# Patient Record
Sex: Male | Born: 1965 | Race: White | Hispanic: Yes | Marital: Married | Smoking: Never smoker
Health system: Southern US, Community
[De-identification: ages and names within clinical notes are randomized; demographics above are authoritative.]

## PROBLEM LIST (undated history)

## (undated) DIAGNOSIS — F1010 Alcohol abuse, uncomplicated: Secondary | ICD-10-CM

## (undated) DIAGNOSIS — E785 Hyperlipidemia, unspecified: Secondary | ICD-10-CM

## (undated) DIAGNOSIS — E669 Obesity, unspecified: Secondary | ICD-10-CM

## (undated) DIAGNOSIS — K2970 Gastritis, unspecified, without bleeding: Secondary | ICD-10-CM

## (undated) DIAGNOSIS — B179 Acute viral hepatitis, unspecified: Secondary | ICD-10-CM

## (undated) DIAGNOSIS — M109 Gout, unspecified: Secondary | ICD-10-CM

## (undated) DIAGNOSIS — K649 Unspecified hemorrhoids: Secondary | ICD-10-CM

## (undated) DIAGNOSIS — K5790 Diverticulosis of intestine, part unspecified, without perforation or abscess without bleeding: Secondary | ICD-10-CM

## (undated) DIAGNOSIS — Z86010 Personal history of colonic polyps: Secondary | ICD-10-CM

## (undated) DIAGNOSIS — D509 Iron deficiency anemia, unspecified: Secondary | ICD-10-CM

## (undated) HISTORY — DX: Gout, unspecified: M10.9

## (undated) HISTORY — DX: Obesity, unspecified: E66.9

## (undated) HISTORY — DX: Unspecified hemorrhoids: K64.9

## (undated) HISTORY — DX: Alcohol abuse, uncomplicated: F10.10

## (undated) HISTORY — DX: Personal history of colonic polyps: Z86.010

## (undated) HISTORY — DX: Acute viral hepatitis, unspecified: B17.9

## (undated) HISTORY — PX: KNEE ARTHROSCOPY: SUR90

## (undated) HISTORY — DX: Iron deficiency anemia, unspecified: D50.9

## (undated) HISTORY — DX: Diverticulosis of intestine, part unspecified, without perforation or abscess without bleeding: K57.90

## (undated) HISTORY — DX: Gastritis, unspecified, without bleeding: K29.70

---

## 1998-11-01 ENCOUNTER — Emergency Department (HOSPITAL_COMMUNITY): Admission: EM | Admit: 1998-11-01 | Discharge: 1998-11-01 | Payer: Self-pay | Admitting: Emergency Medicine

## 2000-08-11 ENCOUNTER — Emergency Department (HOSPITAL_COMMUNITY): Admission: EM | Admit: 2000-08-11 | Discharge: 2000-08-11 | Payer: Self-pay | Admitting: Emergency Medicine

## 2003-03-13 ENCOUNTER — Emergency Department (HOSPITAL_COMMUNITY): Admission: EM | Admit: 2003-03-13 | Discharge: 2003-03-14 | Payer: Self-pay | Admitting: *Deleted

## 2005-07-05 ENCOUNTER — Emergency Department (HOSPITAL_COMMUNITY): Admission: EM | Admit: 2005-07-05 | Discharge: 2005-07-05 | Payer: Self-pay | Admitting: Emergency Medicine

## 2006-08-08 ENCOUNTER — Emergency Department (HOSPITAL_COMMUNITY): Admission: EM | Admit: 2006-08-08 | Discharge: 2006-08-08 | Payer: Self-pay | Admitting: Family Medicine

## 2009-10-01 ENCOUNTER — Emergency Department (HOSPITAL_COMMUNITY): Admission: EM | Admit: 2009-10-01 | Discharge: 2009-10-01 | Payer: Self-pay | Admitting: Family Medicine

## 2009-10-01 ENCOUNTER — Inpatient Hospital Stay (HOSPITAL_COMMUNITY): Admission: EM | Admit: 2009-10-01 | Discharge: 2009-10-03 | Payer: Self-pay | Admitting: Emergency Medicine

## 2010-11-30 LAB — CBC
HCT: 37.7 % — ABNORMAL LOW (ref 39.0–52.0)
MCHC: 35.4 g/dL (ref 30.0–36.0)
MCV: 93.3 fL (ref 78.0–100.0)
Platelets: 253 10*3/uL (ref 150–400)
RDW: 13.1 % (ref 11.5–15.5)
WBC: 7.9 10*3/uL (ref 4.0–10.5)

## 2010-11-30 LAB — DIFFERENTIAL
Basophils Absolute: 0 10*3/uL (ref 0.0–0.1)
Basophils Relative: 0 % (ref 0–1)
Eosinophils Absolute: 0 10*3/uL (ref 0.0–0.7)
Eosinophils Relative: 1 % (ref 0–5)
Lymphs Abs: 1.3 10*3/uL (ref 0.7–4.0)
Neutrophils Relative %: 78 % — ABNORMAL HIGH (ref 43–77)

## 2010-11-30 LAB — BASIC METABOLIC PANEL
BUN: 12 mg/dL (ref 6–23)
Chloride: 100 mEq/L (ref 96–112)
Creatinine, Ser: 0.65 mg/dL (ref 0.4–1.5)
Glucose, Bld: 96 mg/dL (ref 70–99)
Potassium: 3.2 mEq/L — ABNORMAL LOW (ref 3.5–5.1)

## 2010-11-30 LAB — PROTIME-INR
INR: 0.99 (ref 0.00–1.49)
Prothrombin Time: 13 seconds (ref 11.6–15.2)

## 2012-06-23 ENCOUNTER — Emergency Department (HOSPITAL_COMMUNITY): Payer: Self-pay

## 2012-06-23 ENCOUNTER — Encounter (HOSPITAL_COMMUNITY): Payer: Self-pay | Admitting: Emergency Medicine

## 2012-06-23 ENCOUNTER — Observation Stay (HOSPITAL_COMMUNITY)
Admission: EM | Admit: 2012-06-23 | Discharge: 2012-06-24 | Disposition: A | Discharge status: Home / Self Care | Payer: Self-pay | Attending: General Surgery | Admitting: General Surgery

## 2012-06-23 DIAGNOSIS — M545 Low back pain, unspecified: Secondary | ICD-10-CM

## 2012-06-23 DIAGNOSIS — S2242XA Multiple fractures of ribs, left side, initial encounter for closed fracture: Secondary | ICD-10-CM | POA: Diagnosis present

## 2012-06-23 DIAGNOSIS — S32009A Unspecified fracture of unspecified lumbar vertebra, initial encounter for closed fracture: Secondary | ICD-10-CM

## 2012-06-23 DIAGNOSIS — I959 Hypotension, unspecified: Secondary | ICD-10-CM | POA: Insufficient documentation

## 2012-06-23 DIAGNOSIS — S2239XA Fracture of one rib, unspecified side, initial encounter for closed fracture: Secondary | ICD-10-CM

## 2012-06-23 DIAGNOSIS — S2249XA Multiple fractures of ribs, unspecified side, initial encounter for closed fracture: Principal | ICD-10-CM

## 2012-06-23 DIAGNOSIS — W11XXXA Fall on and from ladder, initial encounter: Secondary | ICD-10-CM | POA: Insufficient documentation

## 2012-06-23 DIAGNOSIS — W19XXXA Unspecified fall, initial encounter: Secondary | ICD-10-CM | POA: Diagnosis present

## 2012-06-23 LAB — PROTIME-INR
INR: 1.03 (ref 0.00–1.49)
Prothrombin Time: 13.4 s (ref 11.6–15.2)

## 2012-06-23 LAB — COMPREHENSIVE METABOLIC PANEL
Albumin: 4.1 g/dL (ref 3.5–5.2)
Alkaline Phosphatase: 50 U/L (ref 39–117)
BUN: 12 mg/dL (ref 6–23)
Chloride: 104 mEq/L (ref 96–112)
Creatinine, Ser: 0.67 mg/dL (ref 0.50–1.35)
GFR calc Af Amer: >90 mL/min (ref >90)
GFR calc non Af Amer: >90 mL/min (ref >90)
Glucose, Bld: 105 mg/dL — ABNORMAL HIGH (ref 70–99)
Potassium: 3.9 mEq/L (ref 3.5–5.1)
Total Bilirubin: 0.5 mg/dL (ref 0.3–1.2)

## 2012-06-23 LAB — POCT I-STAT, CHEM 8
BUN: 15 mg/dL (ref 6–23)
BUN: 13 mg/dL (ref 6–23)
Calcium, Ion: 1.24 mmol/L — ABNORMAL HIGH (ref 1.12–1.23)
Calcium, Ion: 1.15 mmol/L (ref 1.12–1.23)
Chloride: 105 mEq/L (ref 96–112)
Chloride: 105 meq/L (ref 96–112)
Creatinine, Ser: 1 mg/dL (ref 0.50–1.35)
Creatinine, Ser: 0.8 mg/dL (ref 0.50–1.35)
Glucose, Bld: 93 mg/dL (ref 70–99)
Glucose, Bld: 101 mg/dL — ABNORMAL HIGH (ref 70–99)
HCT: 38 % — ABNORMAL LOW (ref 39.0–52.0)
HCT: 37 % — ABNORMAL LOW (ref 39.0–52.0)
Hemoglobin: 12.9 g/dL — ABNORMAL LOW (ref 13.0–17.0)
Hemoglobin: 12.6 g/dL — ABNORMAL LOW (ref 13.0–17.0)
Potassium: 4.1 meq/L (ref 3.5–5.1)
Potassium: 3.7 meq/L (ref 3.5–5.1)
Sodium: 140 meq/L (ref 135–145)
Sodium: 141 meq/L (ref 135–145)
TCO2: 24 mmol/L (ref 0–100)
TCO2: 23 mmol/L (ref 0–100)

## 2012-06-23 LAB — URINALYSIS, MICROSCOPIC ONLY
Glucose, UA: NEGATIVE mg/dL
Ketones, ur: NEGATIVE mg/dL
Leukocytes, UA: NEGATIVE
Nitrite: NEGATIVE
Protein, ur: NEGATIVE mg/dL
Urobilinogen, UA: 0.2 mg/dL (ref 0.0–1.0)

## 2012-06-23 LAB — CBC
HCT: 35.1 % — ABNORMAL LOW (ref 39.0–52.0)
Hemoglobin: 12 g/dL — ABNORMAL LOW (ref 13.0–17.0)
MCH: 31 pg (ref 26.0–34.0)
MCHC: 34.2 g/dL (ref 30.0–36.0)
MCV: 90.7 fL (ref 78.0–100.0)
Platelets: 230 K/uL (ref 150–400)
RBC: 3.87 MIL/uL — ABNORMAL LOW (ref 4.22–5.81)
RDW: 13.2 % (ref 11.5–15.5)
WBC: 10.4 K/uL (ref 4.0–10.5)

## 2012-06-23 LAB — SAMPLE TO BLOOD BANK

## 2012-06-23 LAB — LACTIC ACID, PLASMA: Lactic Acid, Venous: 4 mmol/L — ABNORMAL HIGH (ref 0.5–2.2)

## 2012-06-23 MED ORDER — SODIUM CHLORIDE 0.9 % IV SOLN
INTRAVENOUS | Status: DC
  Administered 2012-06-23: via INTRAVENOUS

## 2012-06-23 MED ORDER — KETOROLAC TROMETHAMINE 15 MG/ML IJ SOLN
15.0000 mg | Freq: Four times a day (QID) | INTRAMUSCULAR | Status: DC | PRN
  Filled 2012-06-23: qty 1

## 2012-06-23 MED ORDER — MORPHINE SULFATE 4 MG/ML IJ SOLN
4.0000 mg | Freq: Once | INTRAMUSCULAR | Status: AC
  Administered 2012-06-23: 4 mg via INTRAVENOUS
  Filled 2012-06-23: qty 1

## 2012-06-23 MED ORDER — ACETAMINOPHEN 325 MG PO TABS: 650.0000 mg | ORAL_TABLET | ORAL | Status: DC | PRN

## 2012-06-23 MED ORDER — OXYCODONE HCL 5 MG PO TABS
10.0000 mg | ORAL_TABLET | ORAL | Status: DC | PRN
  Administered 2012-06-24: 10 mg via ORAL
  Filled 2012-06-23: qty 2

## 2012-06-23 MED ORDER — SODIUM CHLORIDE 0.9 % IV SOLN
Freq: Once | INTRAVENOUS | Status: AC
  Administered 2012-06-23: 14:00:00 via INTRAVENOUS

## 2012-06-23 MED ORDER — OXYCODONE-ACETAMINOPHEN 5-325 MG PO TABS: ORAL_TABLET | ORAL | Status: DC

## 2012-06-23 MED ORDER — IOHEXOL 300 MG/ML  SOLN
80.0000 mL | Freq: Once | INTRAMUSCULAR | Status: AC | PRN
  Administered 2012-06-23: 80 mL via INTRAVENOUS

## 2012-06-23 MED ORDER — ONDANSETRON HCL 4 MG/2ML IJ SOLN: 4.0000 mg | Freq: Four times a day (QID) | INTRAMUSCULAR | Status: DC | PRN

## 2012-06-23 MED ORDER — MORPHINE SULFATE 2 MG/ML IJ SOLN: 2.0000 mg | INTRAMUSCULAR | Status: DC | PRN

## 2012-06-23 MED ORDER — DOCUSATE SODIUM 100 MG PO CAPS
100.0000 mg | ORAL_CAPSULE | Freq: Two times a day (BID) | ORAL | Status: DC
  Administered 2012-06-23 – 2012-06-24 (×2): 100 mg via ORAL
  Filled 2012-06-23 (×3): qty 1

## 2012-06-23 MED ORDER — HYDROCODONE-ACETAMINOPHEN 5-325 MG PO TABS
2.0000 | ORAL_TABLET | Freq: Once | ORAL | Status: AC
  Administered 2012-06-23: 2 via ORAL
  Filled 2012-06-23: qty 2

## 2012-06-23 MED ORDER — ONDANSETRON HCL 4 MG/2ML IJ SOLN
4.0000 mg | Freq: Once | INTRAMUSCULAR | Status: AC
  Administered 2012-06-23: 4 mg via INTRAVENOUS
  Filled 2012-06-23: qty 2

## 2012-06-23 MED ORDER — OXYCODONE HCL 5 MG PO TABS: 5.0000 mg | ORAL_TABLET | ORAL | Status: DC | PRN

## 2012-06-23 MED ORDER — HYDROMORPHONE HCL PF 1 MG/ML IJ SOLN
1.0000 mg | INTRAMUSCULAR | Status: DC
  Filled 2012-06-23: qty 1

## 2012-06-23 MED ORDER — ONDANSETRON HCL 4 MG PO TABS: 4.0000 mg | ORAL_TABLET | Freq: Four times a day (QID) | ORAL | Status: DC | PRN

## 2012-06-23 MED ORDER — ENOXAPARIN SODIUM 40 MG/0.4ML ~~LOC~~ SOLN
40.0000 mg | SUBCUTANEOUS | Status: DC
  Administered 2012-06-24: 40 mg via SUBCUTANEOUS
  Filled 2012-06-23: qty 0.4

## 2012-06-23 NOTE — ED Provider Notes (Signed)
1716 p.m. I was called to the bedside and patient was hypotensive on standing. On exam he's alert Glasgow Coma Score lungs cta chest tender left side anterior side  anteriorly abdomen tender atluq. Level 1 trauma called  Doug Sou, MD 06/23/12 [phone]

## 2012-06-23 NOTE — Progress Notes (Signed)
I was in ed when pt arrived. He gave contact info to call his brother; which I did; and pt brother called pt's wife. Two co-workers arrived first and visited w/pt. His wife arrived later and then his brother. I provided comfort to pt and family. They were very appreciative.  Marjory Lies Chaplain

## 2012-06-23 NOTE — ED Notes (Signed)
Family at bedside. Pt became hypotensive while standing to be discharged in CDU.  Pt brought back to Room B.  Pt pale. C/o left mid quad pain rates at "alot"

## 2012-06-23 NOTE — ED Notes (Signed)
Report given to Sherri, RN.

## 2012-06-23 NOTE — ED Notes (Signed)
IV x 2 infusing at 999cc/hr.

## 2012-06-23 NOTE — ED Notes (Signed)
Patient transported to CT 

## 2012-06-23 NOTE — ED Notes (Signed)
Family at bedside. Pt c/o pain in left rib area. No shob

## 2012-06-23 NOTE — H&P (Signed)
Nathan Kane is an 46 y.o. male.   Chief Complaint: left chest pain HPI: 46 yom s/p fall from ladder about 20 feet, complains of left chest pain and low back pain.  He was in er for five hours already and was going to be discharged by er.  He stood up and became hypotensive.  He states this feeling was due to intense pain in left ribs.  He is better after lying down.  A level one was called from in the er.  When I saw him he was complaining only of left rib sided pain.  His vitals were normal.  History reviewed. No pertinent past medical history.  No past surgical history on file.  No family history on file. Social History:  does not have a smoking history on file. He does not have any smokeless tobacco history on file. His alcohol and drug histories not on file.  Allergies: No Known Allergies  Meds none  Results for orders placed during the hospital encounter of 06/23/12 (from the past 48 hour(s))  POCT I-STAT, CHEM 8     Status: Abnormal   Collection Time   06/23/12 12:05 PM      Component Value Range Comment   Sodium 140  135 - 145 mEq/L    Potassium 4.1  3.5 - 5.1 mEq/L    Chloride 105  96 - 112 mEq/L    BUN 15  6 - 23 mg/dL    Creatinine, Ser 2.53  0.50 - 1.35 mg/dL    Glucose, Bld 93  70 - 99 mg/dL    Calcium, Ion 6.64 (*) 1.12 - 1.23 mmol/L    TCO2 24  0 - 100 mmol/L    Hemoglobin 12.9 (*) 13.0 - 17.0 g/dL    HCT 40.3 (*) 47.4 - 52.0 %   CBC     Status: Abnormal   Collection Time   06/23/12  5:50 PM      Component Value Range Comment   WBC 10.4  4.0 - 10.5 K/uL    RBC 3.87 (*) 4.22 - 5.81 MIL/uL    Hemoglobin 12.0 (*) 13.0 - 17.0 g/dL    HCT 25.9 (*) 56.3 - 52.0 %    MCV 90.7  78.0 - 100.0 fL    MCH 31.0  26.0 - 34.0 pg    MCHC 34.2  30.0 - 36.0 g/dL    RDW 87.5  64.3 - 32.9 %    Platelets 230  150 - 400 K/uL   CDS SEROLOGY     Status: Normal   Collection Time   06/23/12  5:50 PM      Component Value Range Comment   CDS serology specimen        Value:  SPECIMEN WILL BE HELD FOR 14 DAYS IF TESTING IS REQUIRED  COMPREHENSIVE METABOLIC PANEL     Status: Abnormal   Collection Time   06/23/12  5:50 PM      Component Value Range Comment   Sodium 142  135 - 145 mEq/L    Potassium 3.9  3.5 - 5.1 mEq/L    Chloride 104  96 - 112 mEq/L    CO2 23  19 - 32 mEq/L    Glucose, Bld 105 (*) 70 - 99 mg/dL    BUN 12  6 - 23 mg/dL    Creatinine, Ser 5.18  0.50 - 1.35 mg/dL    Calcium 9.3  8.4 - 84.1 mg/dL    Total Protein 6.9  6.0 - 8.3 g/dL    Albumin 4.1  3.5 - 5.2 g/dL    AST 32  0 - 37 U/L    ALT 19  0 - 53 U/L    Alkaline Phosphatase 50  39 - 117 U/L    Total Bilirubin 0.5  0.3 - 1.2 mg/dL    GFR calc non Af Amer >90  >90 mL/min    GFR calc Af Amer >90  >90 mL/min   LACTIC ACID, PLASMA     Status: Abnormal   Collection Time   06/23/12  5:50 PM      Component Value Range Comment   Lactic Acid, Venous 4.0 (*) 0.5 - 2.2 mmol/L   PROTIME-INR     Status: Normal   Collection Time   06/23/12  5:50 PM      Component Value Range Comment   Prothrombin Time 13.4  11.6 - 15.2 seconds    INR 1.03  0.00 - 1.49   SAMPLE TO BLOOD BANK     Status: Normal   Collection Time   06/23/12  5:50 PM      Component Value Range Comment   Blood Bank Specimen SAMPLE AVAILABLE FOR TESTING      Sample Expiration 06/24/2012     URINALYSIS, MICROSCOPIC ONLY     Status: Normal   Collection Time   06/23/12  5:59 PM      Component Value Range Comment   Color, Urine YELLOW  YELLOW    APPearance CLEAR  CLEAR    Specific Gravity, Urine 1.020  1.005 - 1.030    pH 5.5  5.0 - 8.0    Glucose, UA NEGATIVE  NEGATIVE mg/dL    Hgb urine dipstick NEGATIVE  NEGATIVE    Bilirubin Urine NEGATIVE  NEGATIVE    Ketones, ur NEGATIVE  NEGATIVE mg/dL    Protein, ur NEGATIVE  NEGATIVE mg/dL    Urobilinogen, UA 0.2  0.0 - 1.0 mg/dL    Nitrite NEGATIVE  NEGATIVE    Leukocytes, UA NEGATIVE  NEGATIVE    WBC, UA 0-2  <3 WBC/hpf    Squamous Epithelial / LPF RARE  RARE   POCT I-STAT, CHEM  8     Status: Abnormal   Collection Time   06/23/12  5:59 PM      Component Value Range Comment   Sodium 141  135 - 145 mEq/L    Potassium 3.7  3.5 - 5.1 mEq/L    Chloride 105  96 - 112 mEq/L    BUN 13  6 - 23 mg/dL    Creatinine, Ser 4.09  0.50 - 1.35 mg/dL    Glucose, Bld 811 (*) 70 - 99 mg/dL    Calcium, Ion 9.14  7.82 - 1.23 mmol/L    TCO2 23  0 - 100 mmol/L    Hemoglobin 12.6 (*) 13.0 - 17.0 g/dL    HCT 95.6 (*) 21.3 - 52.0 %    Dg Pelvis 1-2 Views  06/23/2012  *RADIOLOGY REPORT*  Clinical Data: Larey Seat from 20 feet.  Pain.  PELVIS - 1-2 VIEW  Comparison: Sacrum and coccyx is reported separately  Findings: The bony pelvis appears intact.  There is no hip dislocation.  Lower lumbar spine unremarkable.  No obvious sacral fracture although portions are obscured by the contrast-filled bladder.  There is a radiopaque density to the right of the ilium (arrow). This likely represents material that is outside the patient, perhaps on the stretcher or gown although I cannot  exclude an embedded radiopaque foreign body.  Correlate clinically.  IMPRESSION: No visible fracture.  Radiopaque density to the right of the pelvis could be external to the patient or represent a foreign body.   Original Report Authenticated By: Elsie Stain, M.D.    Dg Sacrum/coccyx  06/23/2012  *RADIOLOGY REPORT*  Clinical Data: Fall, pain  SACRUM AND COCCYX - 2+ VIEW  Comparison:  None.  Findings:  There is no evidence of fracture or other focal bone lesions. Portions of the sacrum are obscured by the bladder filled with contrast.  If strong concern remains for fracture, recommend CT scan of the pelvis.  IMPRESSION: Negative.   Original Report Authenticated By: Elsie Stain, M.D.    Ct Head Wo Contrast  06/23/2012  *RADIOLOGY REPORT*  Clinical Data:  Fall, possible loss of consciousness  CT HEAD WITHOUT CONTRAST  Technique:  Contiguous axial images were obtained from the base of the skull through the vertex without  contrast.  Comparison: None.  Findings: No acute intracranial hemorrhage, acute infarction, mass, mass lesion, hydrocephalus, or midline shift.  Punctate calcification of the gray-white interface of the inferior left frontal lobe suggests the sequela of a prior infectious/inflammatory process such as neurocysticercosis. The gray-white interface is otherwise intact.  The globes are intact. No focal soft tissue swelling, or scalp hematoma.  Normal aeration of the mastoid air cells and visualized paranasal sinuses.  No bony calvarial abnormality.  IMPRESSION: 1.  No acute intracranial abnormality. 2.  Punctate calcification at the gray-white interface of the inferior left frontal lobe may represent sequela of a remote infectious/inflammatory process such as neurocysticercosis.   Original Report Authenticated By: Sterling Big, M.D.    Ct Chest W Contrast  06/23/2012  *RADIOLOGY REPORT*  Clinical Data: History of fall complaining of left-sided chest pain.  Widened mediastinum noted on chest x-ray.  CT CHEST WITH CONTRAST  Technique:  Multidetector CT imaging of the chest was performed following the standard protocol during bolus administration of intravenous contrast.  Contrast: 80mL OMNIPAQUE IOHEXOL 300 MG/ML  SOLN  Comparison: No priors.  Findings:  Mediastinum: Heart size is normal. There is no significant pericardial fluid, thickening or pericardial calcification. No acute abnormality of the thoracic aorta; specifically, no aneurysm or dissection. No pathologically enlarged mediastinal or hilar lymph nodes. Esophagus is unremarkable in appearance.  No pneumomediastinum.  No abnormal fluid collections are noted within the mediastinum to suggest hematoma/hemorrhage.  Lungs/Pleura: No pneumothorax.  No airspace consolidation to suggest contusion or aspiration.  Dependent atelectasis is noted in the lower lobes of the lungs bilaterally.  No definite suspicious appearing pulmonary nodules or masses are  identified.  No pleural effusions.  Upper Abdomen: Elevation of the left hemidiaphragm.  Musculoskeletal:   There are multiple nondisplaced fractures of the anterior and anterolateral aspect of the left fourth, fifth, sixth, seventh and eighth ribs.  Nondisplaced fractures of the posterolateral aspect of the left fifth and sixth ribs are also noted. There are no aggressive appearing lytic or blastic lesions noted in the visualized portions of the skeleton.  IMPRESSION: 1.  Numerous nondisplaced left sided rib fractures (left fourth through eighth ribs), as above, without evidence of pneumothorax or other significant acute intrathoracic trauma. 2.  Elevation of the left hemidiaphragm.   Original Report Authenticated By: Florencia Reasons, M.D.    Ct Abdomen Pelvis W Contrast  06/23/2012  *RADIOLOGY REPORT*  Clinical Data: Hypotension.  Left abdominal pain.  Fall from a tree.  Abdominal distention.  Left rib fractures on prior chest CT.  CT ABDOMEN AND PELVIS WITH CONTRAST  Technique:  Multidetector CT imaging of the abdomen and pelvis was performed following the standard protocol during bolus administration of intravenous contrast.  Contrast: 80mL OMNIPAQUE IOHEXOL 300 MG/ML  SOLN  Comparison: Multiple exams from 06/23/2012  Findings: No basilar pneumothorax.  Dependent subsegmental atelectasis noted in both lower lobes.  Lower anterior rib fractures noted, primarily nondisplaced no significant hemothorax at the lung bases.  No pericardial effusion.  No well-defined splenic laceration or perisplenic ascites.  Liver unremarkable.  Mild dependent density in the gallbladder may represent tiny layering gallstones.  Pancreas and adrenal glands unremarkable.  Kidneys unremarkable. Retroaortic left renal vein noted.  No bowel wall thickening or dilated bowel observed.  Appendix unremarkable.  Urinary bladder normal.  No ascites.  Relatively nondisplaced fracture of the left L3 transverse process noted, image 48 of  series 2.  Spurring of both sacroiliac joints noted.  No definite hip or pelvic fracture noted.  IMPRESSION:  1.  Left anterior nondisplaced rib fractures. 2.  Nondisplaced fracture of the left transverse process of L3. 3.  No ascites or solid organ laceration identified. 4.  Dependent subsegmental atelectasis in the lower lobes. 5.  No bowel wall dilatation observed. 6.  Possible small layering gallstones or sludge in the gallbladder accounting for mild dependent density.   Original Report Authenticated By: Dellia Cloud, M.D.    Dg Chest Port 1 View  06/23/2012  *RADIOLOGY REPORT*  Clinical Data: Chest pain, trauma, fell from a tree  PORTABLE CHEST - 1 VIEW  Comparison: Portable exam 1203 hours without priors for comparison  Findings: Normal heart size and pulmonary vascularity. Prominent superior mediastinal soft tissues, may be related to portable supine technique. Lungs clear. No gross pleural effusion or pneumothorax. No definite fractures.  IMPRESSION: Minimally prominent superior mediastinum, may be related to portable supine technique. Follow-up upright PA and lateral chest radiographs recommended.   Original Report Authenticated By: Lollie Marrow, M.D.     Review of Systems  Respiratory: Negative for cough, shortness of breath and wheezing.   Cardiovascular: Negative for chest pain.  Gastrointestinal: Negative for abdominal pain.    Blood pressure 118/67, pulse 96, temperature 99 F (37.2 C), temperature source Oral, resp. rate 14, SpO2 99.00%. Physical Exam  Vitals reviewed. Constitutional: He is oriented to person, place, and time. He appears well-developed and well-nourished.  HENT:  Head: Normocephalic and atraumatic.  Right Ear: External ear normal.  Left Ear: External ear normal.  Mouth/Throat: Oropharynx is clear and moist.  Eyes: Conjunctivae normal and EOM are normal. Pupils are equal, round, and reactive to light.  Neck: Normal range of motion. Neck supple.    Cardiovascular: Normal rate, regular rhythm, normal heart sounds and intact distal pulses.   Respiratory: Effort normal and breath sounds normal. No respiratory distress. He has no wheezes. He has no rales. He exhibits tenderness (left lateral chest tender to palpation).  GI: Soft. He exhibits no distension. There is no tenderness.  Musculoskeletal: Normal range of motion. He exhibits tenderness (lumbar region).  Lymphadenopathy:    He has no cervical adenopathy.  Neurological: He is alert and oriented to person, place, and time.  Skin: Skin is warm and dry.     Assessment/Plan S/p fall with left sided rib fx, nondisplaced L3 tp fracture 1. Pain control 2. Aggressive pulmonary toilet due to rib fractures 3. Repeat cxr in am although no ptx even by ct now  4. l3 fracture is nondisplaced and TP, will get him up with pt in am 5. Lovenox.scds.  Kasidi Shanker 06/23/2012, 6:58 PM

## 2012-06-23 NOTE — ED Notes (Signed)
Accompanied pt to CT scan. Pt tolerated well. Pt color pink, skin warm, dry. Pt remains alert and oriented x 4

## 2012-06-23 NOTE — ED Notes (Signed)
Returned from CT. Alert, oriented, cont. To c/o pain in left upper quad area. Unable to rate pain but states it has not changed any since arrival.

## 2012-06-23 NOTE — ED Notes (Signed)
Pt to xray via stretcher, family moved to CDU 4

## 2012-06-23 NOTE — ED Notes (Signed)
Pt brought to room by Helaine Chess, RN and report received.  Pt ambulated from trauma stretcher to stretcher in room.  Family at bedside.  Pt positioned for comfort.  Alert/ oriented.  Skin warm/ dry.  Resps even/ unlabored.  NAD.

## 2012-06-23 NOTE — Progress Notes (Signed)
I returned to ck on pt. Wife, 80yr old daughter and brother were bedside. Pt said he was ok but complained of pain in his side. I asked if he wanted me to notify his nurse and he said he thinks she gave him something. He said he would be ok. Wife and brother said they were ok. They thanked me for cking back w/them.  Marjory Lies chaplain

## 2012-06-23 NOTE — ED Notes (Signed)
Entered pt room to give ordered pain med and prepare for d/c.  Pt sitting on side of bed.  Noted to be pale and diaphoretic.  States has continued pain to left back and feels nauseous.  EShelva Majestic, PA notified of pt status and at pt bedside.

## 2012-06-23 NOTE — ED Notes (Signed)
Pt called wife, she is on her way

## 2012-06-23 NOTE — Progress Notes (Signed)
Chaplain Note:  Chaplain responded immediately to LV 1 trauma page received at 17:36.  Pt was in trauma bay being treated by [facility] staff.  Pt's family was at bedside.  Chaplain provided spiritual comfort and support for pt and family.  Pt expressed appreciation for chaplain support. Chaplain will follow up as needed.  06/23/12 1736  Clinical Encounter Type  Visited With Patient and family together  Visit Type Spiritual support  Referral From Other (Comment) (trauma page)  Spiritual Encounters  Spiritual Needs Emotional  Stress Factors  Patient Stress Factors Major life changes;Health changes  Family Stress Factors Major life changes   Verdie Shire, [place] [phone]

## 2012-06-23 NOTE — ED Notes (Signed)
Ambulated with pt in room. Pt c/o "bad back pain" when standing or ambulating.  Points to lower back slightly to the left side. Wife at bedside.  Paper pants given to patient

## 2012-06-23 NOTE — ED Notes (Signed)
Family remains at bedside.

## 2012-06-23 NOTE — ED Notes (Signed)
Lab at bedside, MD at bedside

## 2012-06-23 NOTE — ED Provider Notes (Addendum)
History     CSN: [id number]  Arrival date & time 06/23/12  1145   First MD Initiated Contact with Patient 06/23/12 1157      Chief Complaint  Patient presents with  . 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  Pt had some pain to left rib cage and also blacked out for about 3 minutes per coworkers.  EMS reports as he was being brought on board and ccollar in place, he had some repetative questioning once.  denies HA or neck pain, no back pain.  Not on blood thinners, is otherwise healthy.  denies abd pain.    Patient is a 46 y.o. male presenting with fall. The history is provided by the patient and the EMS personnel.  Fall Pertinent negatives include no abdominal pain and no headaches.    History reviewed. No pertinent past medical history.  No past surgical history on file.  No family history on file.  History  Substance Use Topics  . Smoking status: Not on file  . Smokeless tobacco: Not on file  . Alcohol Use: Not on file      Review of Systems  HENT: Negative for neck pain.   Cardiovascular: Positive for chest pain.  Gastrointestinal: Negative for abdominal pain.  Musculoskeletal: Negative for back pain.  Skin: Negative for wound.  Neurological: Positive for syncope. Negative for headaches.  All other systems reviewed and are negative.    Allergies  Review of patient's allergies indicates no known allergies.  Home Medications   Current Outpatient Rx  Name Route Sig Dispense Refill  . OXYCODONE-ACETAMINOPHEN 5-325 MG PO TABS  1-2 tablets po q 6 hours prn moderate to severe pain 20 tablet 0    BP 99/64  Pulse 92  Temp 98.7 F (37.1 C) (Oral)  Resp 13  SpO2 95%  Physical Exam  Nursing note and vitals reviewed. Constitutional: He is oriented to person, place, and time. He appears well-developed and well-nourished.    HENT:  Head: Normocephalic and atraumatic.  Eyes: EOM are normal. Pupils are equal, round, and reactive to light. No scleral icterus.  Neck: Normal range of motion. Neck supple. No spinous process tenderness and no muscular tenderness present. No tracheal deviation present.  Cardiovascular: Normal rate and regular rhythm.   No murmur heard. Pulmonary/Chest: Effort normal and breath sounds normal. No stridor. No respiratory distress. He has no wheezes. He has no rales. He exhibits tenderness.  Abdominal: Soft. He exhibits no distension. There is no tenderness.  Musculoskeletal: He exhibits no edema and no tenderness.       Thoracic back: Normal.       Lumbar back: Normal.       Pelvis is stable, non tender, FROM of both UE and LE  Neurological: He is alert and oriented to person, place, and time. No cranial nerve deficit.  Skin: Skin is warm and dry. No rash noted.  Psychiatric: He has a normal mood and affect.    ED Course  Procedures (including critical care time)  Labs Reviewed  POCT I-STAT, CHEM 8 - Abnormal; Notable for the following:    Calcium, Ion 1.24 (*)     Hemoglobin 12.9 (*)     HCT 38.0 (*)     All other components within normal limits   Dg Pelvis 1-2 Views  06/23/2012  *RADIOLOGY REPORT*  Clinical Data: Larey Seat from 20 feet.  Pain.  PELVIS -  1-2 VIEW  Comparison: Sacrum and coccyx is reported separately  Findings: The bony pelvis appears intact.  There is no hip dislocation.  Lower lumbar spine unremarkable.  No obvious sacral fracture although portions are obscured by the contrast-filled bladder.  There is a radiopaque density to the right of the ilium (arrow). This likely represents material that is outside the patient, perhaps on the stretcher or gown although I cannot exclude an embedded radiopaque foreign body.  Correlate clinically.  IMPRESSION: No visible fracture.  Radiopaque density to the right of the pelvis could be external to the patient or represent a foreign  body.   Original Report Authenticated By: Elsie Stain, M.D.    Dg Sacrum/coccyx  06/23/2012  *RADIOLOGY REPORT*  Clinical Data: Fall, pain  SACRUM AND COCCYX - 2+ VIEW  Comparison:  None.  Findings:  There is no evidence of fracture or other focal bone lesions. Portions of the sacrum are obscured by the bladder filled with contrast.  If strong concern remains for fracture, recommend CT scan of the pelvis.  IMPRESSION: Negative.   Original Report Authenticated By: Elsie Stain, M.D.    Ct Head Wo Contrast  06/23/2012  *RADIOLOGY REPORT*  Clinical Data:  Fall, possible loss of consciousness  CT HEAD WITHOUT CONTRAST  Technique:  Contiguous axial images were obtained from the base of the skull through the vertex without contrast.  Comparison: None.  Findings: No acute intracranial hemorrhage, acute infarction, mass, mass lesion, hydrocephalus, or midline shift.  Punctate calcification of the gray-white interface of the inferior left frontal lobe suggests the sequela of a prior infectious/inflammatory process such as neurocysticercosis. The gray-white interface is otherwise intact.  The globes are intact. No focal soft tissue swelling, or scalp hematoma.  Normal aeration of the mastoid air cells and visualized paranasal sinuses.  No bony calvarial abnormality.  IMPRESSION: 1.  No acute intracranial abnormality. 2.  Punctate calcification at the gray-white interface of the inferior left frontal lobe may represent sequela of a remote infectious/inflammatory process such as neurocysticercosis.   Original Report Authenticated By: Sterling Big, M.D.    Ct Chest W Contrast  06/23/2012  *RADIOLOGY REPORT*  Clinical Data: History of fall complaining of left-sided chest pain.  Widened mediastinum noted on chest x-ray.  CT CHEST WITH CONTRAST  Technique:  Multidetector CT imaging of the chest was performed following the standard protocol during bolus administration of intravenous contrast.  Contrast: 80mL  OMNIPAQUE IOHEXOL 300 MG/ML  SOLN  Comparison: No priors.  Findings:  Mediastinum: Heart size is normal. There is no significant pericardial fluid, thickening or pericardial calcification. No acute abnormality of the thoracic aorta; specifically, no aneurysm or dissection. No pathologically enlarged mediastinal or hilar lymph nodes. Esophagus is unremarkable in appearance.  No pneumomediastinum.  No abnormal fluid collections are noted within the mediastinum to suggest hematoma/hemorrhage.  Lungs/Pleura: No pneumothorax.  No airspace consolidation to suggest contusion or aspiration.  Dependent atelectasis is noted in the lower lobes of the lungs bilaterally.  No definite suspicious appearing pulmonary nodules or masses are identified.  No pleural effusions.  Upper Abdomen: Elevation of the left hemidiaphragm.  Musculoskeletal:   There are multiple nondisplaced fractures of the anterior and anterolateral aspect of the left fourth, fifth, sixth, seventh and eighth ribs.  Nondisplaced fractures of the posterolateral aspect of the left fifth and sixth ribs are also noted. There are no aggressive appearing lytic or blastic lesions noted in the visualized portions of the skeleton.  IMPRESSION:  1.  Numerous nondisplaced left sided rib fractures (left fourth through eighth ribs), as above, without evidence of pneumothorax or other significant acute intrathoracic trauma. 2.  Elevation of the left hemidiaphragm.   Original Report Authenticated By: Florencia Reasons, M.D.    Dg Chest Port 1 View  06/23/2012  *RADIOLOGY REPORT*  Clinical Data: Chest pain, trauma, fell from a tree  PORTABLE CHEST - 1 VIEW  Comparison: Portable exam 1203 hours without priors for comparison  Findings: Normal heart size and pulmonary vascularity. Prominent superior mediastinal soft tissues, may be related to portable supine technique. Lungs clear. No gross pleural effusion or pneumothorax. No definite fractures.  IMPRESSION: Minimally  prominent superior mediastinum, may be related to portable supine technique. Follow-up upright PA and lateral chest radiographs recommended.   Original Report Authenticated By: Lollie Marrow, M.D.      1. Fracture, ribs   2. Low back pain     Sat 97% on 2L [place] O2, which is adequate.  ECG at time 12:15 shows SR, normal axis, incomplete RBBB, no ST or T Wave abns   3:08 PM CT of chest reveals multiple non displaced rib fractures on left side.  No PTX, mediastinal injury.  Pt's sats are ok, will try to ambulate. Will discuss with trauma regarding follow up.      3:16 PM With standing, pt has pain to right lower back at pelvic region.  Was not present during initial exam.  No lower extremity numbness or weakness.  Will get plain films of sacrum and pelvis.  Prior to this, I spoke to Dr. Janee Morn who agrees, pt can be followed up in trauma clinic.  Will move to CDU.   4:18 PM Plain films of pelvis and sacrum are neg.    MDM  Pt with 20 + foot fall, trauma level 2 by criteria.  No repetitive speech here, oriented.  Will get head CT due to 3 minute LOC.  PCXR shows wide mediastinum along with left CP and mechanism for possible mediastinal injury, will get chest CT as well.  IV morphine for pain.          Gavin Pound. Oletta Lamas, MD 06/23/12 1518  Gavin Pound. Oletta Lamas, MD 06/23/12 [id number]

## 2012-06-23 NOTE — ED Provider Notes (Signed)
4:35 PM Patient is in CDU holding.  Sign out received from Dr Oletta Lamas.  Plan is for standing and ambulating patient, reassessing L-spine.  If pt has pain in L-spine, will xray.  If not, pt to be d/c home.  Dr Oletta Lamas has already printed discharge instructions and prescriptions.  Pt reports he continues to have pain, points to his left anterior ribs.  When asked to stand, pt states he needs assistance.  Will have tech assist.    5:00 PM Pt is able to stand and ambulate several steps without pain to lumbar spine.  Lumbar spine is nontender, no crepitus or step-offs.  Pt denies pain in this area.  Plan is for discharge home.  I reviewed the results of rib fractures and plan with patient.  Pt verbalizes understanding and agrees with plan.    5:20 PM Per nurse, patient has become hypotensive and pale.  Dilaudid was held.    5:26 PM Abdomen is tender.  I have spoken with Dr Ethelda Chick regarding new development.   5:30 PM Dr Ethelda Chick has seen patient.  I have spoken with charge nurse and pt is being moved back to Pod B.  Gold trauma panel ordered.    Ct Chest W Contrast  06/23/2012  *RADIOLOGY REPORT*  Clinical Data: History of fall complaining of left-sided chest pain.  Widened mediastinum noted on chest x-ray.  CT CHEST WITH CONTRAST  Technique:  Multidetector CT imaging of the chest was performed following the standard protocol during bolus administration of intravenous contrast.  Contrast: 80mL OMNIPAQUE IOHEXOL 300 MG/ML  SOLN  Comparison: No priors.  Findings:  Mediastinum: Heart size is normal. There is no significant pericardial fluid, thickening or pericardial calcification. No acute abnormality of the thoracic aorta; specifically, no aneurysm or dissection. No pathologically enlarged mediastinal or hilar lymph nodes. Esophagus is unremarkable in appearance.  No pneumomediastinum.  No abnormal fluid collections are noted within the mediastinum to suggest hematoma/hemorrhage.  Lungs/Pleura: No pneumothorax.   No airspace consolidation to suggest contusion or aspiration.  Dependent atelectasis is noted in the lower lobes of the lungs bilaterally.  No definite suspicious appearing pulmonary nodules or masses are identified.  No pleural effusions.  Upper Abdomen: Elevation of the left hemidiaphragm.  Musculoskeletal:   There are multiple nondisplaced fractures of the anterior and anterolateral aspect of the left fourth, fifth, sixth, seventh and eighth ribs.  Nondisplaced fractures of the posterolateral aspect of the left fifth and sixth ribs are also noted. There are no aggressive appearing lytic or blastic lesions noted in the visualized portions of the skeleton.  IMPRESSION: 1.  Numerous nondisplaced left sided rib fractures (left fourth through eighth ribs), as above, without evidence of pneumothorax or other significant acute intrathoracic trauma. 2.  Elevation of the left hemidiaphragm.   Original Report Authenticated By: Florencia Reasons, M.D.         [place], [place] 06/23/12 2142

## 2012-06-23 NOTE — ED Notes (Signed)
Attempted to call report to 5N.  Wynona Canes, RN to call back.

## 2012-06-24 ENCOUNTER — Observation Stay (HOSPITAL_COMMUNITY): Payer: Self-pay

## 2012-06-24 DIAGNOSIS — S32009A Unspecified fracture of unspecified lumbar vertebra, initial encounter for closed fracture: Secondary | ICD-10-CM | POA: Diagnosis present

## 2012-06-24 DIAGNOSIS — W19XXXA Unspecified fall, initial encounter: Secondary | ICD-10-CM | POA: Diagnosis present

## 2012-06-24 DIAGNOSIS — S2242XA Multiple fractures of ribs, left side, initial encounter for closed fracture: Secondary | ICD-10-CM | POA: Diagnosis present

## 2012-06-24 LAB — BASIC METABOLIC PANEL
BUN: 9 mg/dL (ref 6–23)
CO2: 25 mEq/L (ref 19–32)
Calcium: 9.1 mg/dL (ref 8.4–10.5)
Chloride: 104 mEq/L (ref 96–112)
Creatinine, Ser: 0.6 mg/dL (ref 0.50–1.35)
GFR calc Af Amer: >90 mL/min (ref >90)

## 2012-06-24 MED ORDER — NAPROXEN 500 MG PO TABS: 500.0000 mg | ORAL_TABLET | Freq: Two times a day (BID) | ORAL | Status: DC

## 2012-06-24 MED ORDER — HYDROCODONE-ACETAMINOPHEN 5-325 MG PO TABS: 1.0000 | ORAL_TABLET | ORAL | Status: DC | PRN

## 2012-06-24 NOTE — Progress Notes (Signed)
Agree Collis Thede, MD, MPH, FACS Pager: [phone]  

## 2012-06-24 NOTE — Discharge Summary (Signed)
Physician Discharge Summary  Patient ID: Nathan Kane MRN: [id number] DOB/AGE: [birth year] 46 y.o.  Admit date: 06/23/2012 Discharge date: 06/24/2012  Discharge Diagnoses Patient Active Problem List   Diagnosis Date Noted  . Fall 06/24/2012  . Multiple fractures of ribs of left side 06/24/2012  . Lumbar transverse process fracture 06/24/2012    Consultants None  Procedures None  HPI: Nathan Kane fell from a ladder about 20 feet. He was brought in as a level 2 trauma. Initial workup demonstrated multiple left-sided rib fractures. He was going to be discharged home from the ED but had a syncopal episode when he got up. He was upgraded to a level 1 trauma. His workup was extended to include CT scans of the abdomen and pelvis which showed the L3 transverse process fracture but no other injuries and no reason for the syncope. It was decided that the incident was probably vagal in origin but he was admitted for pain control and observation.   Hospital Course: The patient did well overnight in the hospital. He was able to ambulate without further problems. His pain was tolerable and he had not taken any pain medication since the night before. A follow-up chest x-ray was stable and he was discharged home in good condition in the care of his family.      Medication List     As of 06/24/2012  9:07 AM    TAKE these medications         HYDROcodone-acetaminophen 5-325 MG per tablet   Commonly known as: NORCO/VICODIN   Take 1-2 tablets by mouth every 4 (four) hours as needed for pain.      naproxen 500 MG tablet   Commonly known as: NAPROSYN   Take 1 tablet (500 mg total) by mouth 2 (two) times daily with a meal.      oxyCODONE-acetaminophen 5-325 MG per tablet   Commonly known as: PERCOCET/ROXICET   1-2 tablets po q 6 hours prn moderate to severe pain             Follow-up Information    Call [facility]. (As needed)    Contact information:   [address] [place] [address] [phone]          Signed: Freeman Caldron, PA-C Pager: [phone] General Trauma [facility] Pager: [phone]  06/24/2012, 9:07 AM

## 2012-06-24 NOTE — Progress Notes (Signed)
Patient ID: Nathan Kane, male   DOB: 1965/11/06, 46 y.o.   MRN: [id number]   LOS: 1 day   Subjective: Up walking this am without any presyncope. Has not taken any pain medication. Ready to go home.  Objective: Vital signs in last 24 hours: Temp:  [98 F (36.7 C)-99 F (37.2 C)] 98 F (36.7 C) (10/12 0433) Pulse Rate:  [74-96] 74  (10/12 0433) Resp:  [10-24] 16  (10/12 0433) BP: (86-140)/(58-100) 108/67 mmHg (10/12 0433) SpO2:  [95 %-100 %] 96 % (10/12 0433) Weight:  [178 lb (80.74 kg)] 178 lb (80.74 kg) (10/11 2354)    Lab Results:  CBC  Basename 06/23/12 1759 06/23/12 1750  WBC -- 10.4  HGB 12.6* 12.0*  HCT 37.0* 35.1*  PLT -- 230   BMET  Basename 06/24/12 0639 06/23/12 1759 06/23/12 1750  NA 141 141 --  K 3.7 3.7 --  CL 104 105 --  CO2 25 -- 23  GLUCOSE 98 101* --  BUN 9 13 --  CREATININE 0.60 0.80 --  CALCIUM 9.1 -- 9.3    Radiology PORTABLE CHEST - 1 VIEW  Comparison: CT chest dated 06/23/2012  Findings: Lungs are essentially clear. No pleural effusion or  pneumothorax.  Elevation of the left hemidiaphragm. Associated left basilar  atelectasis.  Cardiomediastinal silhouette is within normal limits.  Minimally displaced left 5th and 6th rib fractures. Additional  fractures are better visualized on CT.  IMPRESSION:  Minimally displaced left 5th and 6th rib fractures. Additional  fractures are better visualized on CT.  No pneumothorax is seen.  Original Report Authenticated By: Charline Bills, M.D.   General appearance: alert and no distress Resp: clear to auscultation bilaterally Cardio: regular rate and rhythm GI: normal findings: bowel sounds normal and soft, non-tender   Assessment/Plan: Fall Multiple left rib fxs L3 TVP fx Dispo -- Home   Freeman Caldron, PA-C Pager: [phone] General Trauma [facility] Pager: [phone]   06/24/2012

## 2012-06-24 NOTE — Discharge Summary (Signed)
Nathan Angevine, MD, MPH, FACS Pager: [phone]  

## 2012-06-25 NOTE — ED Provider Notes (Signed)
Medical screening examination/treatment/procedure(s) were conducted as a shared visit with non-physician practitioner(s) and myself.  I personally evaluated the patient during the encounter   Please see my original H&P note  Nathan Kane. Shalice Woodring, MD 06/25/12 1650

## 2012-06-29 ENCOUNTER — Encounter (INDEPENDENT_AMBULATORY_CARE_PROVIDER_SITE_OTHER): Payer: Self-pay

## 2015-08-22 ENCOUNTER — Emergency Department (HOSPITAL_COMMUNITY)
Admission: EM | Admit: 2015-08-22 | Discharge: 2015-08-22 | Disposition: A | Discharge status: Home / Self Care | Payer: 59 | Attending: Emergency Medicine | Admitting: Emergency Medicine

## 2015-08-22 ENCOUNTER — Encounter (HOSPITAL_COMMUNITY): Payer: Self-pay | Admitting: Emergency Medicine

## 2015-08-22 ENCOUNTER — Emergency Department (HOSPITAL_COMMUNITY): Payer: 59

## 2015-08-22 DIAGNOSIS — Z791 Long term (current) use of non-steroidal anti-inflammatories (NSAID): Secondary | ICD-10-CM | POA: Insufficient documentation

## 2015-08-22 DIAGNOSIS — R0789 Other chest pain: Secondary | ICD-10-CM | POA: Diagnosis not present

## 2015-08-22 DIAGNOSIS — R000 Tachycardia, unspecified: Secondary | ICD-10-CM | POA: Insufficient documentation

## 2015-08-22 DIAGNOSIS — R5383 Other fatigue: Secondary | ICD-10-CM | POA: Diagnosis not present

## 2015-08-22 DIAGNOSIS — J3489 Other specified disorders of nose and nasal sinuses: Secondary | ICD-10-CM | POA: Insufficient documentation

## 2015-08-22 DIAGNOSIS — Z8639 Personal history of other endocrine, nutritional and metabolic disease: Secondary | ICD-10-CM | POA: Diagnosis not present

## 2015-08-22 DIAGNOSIS — R079 Chest pain, unspecified: Secondary | ICD-10-CM | POA: Diagnosis present

## 2015-08-22 DIAGNOSIS — Z8781 Personal history of (healed) traumatic fracture: Secondary | ICD-10-CM | POA: Diagnosis not present

## 2015-08-22 HISTORY — DX: Hyperlipidemia, unspecified: E78.5

## 2015-08-22 LAB — BASIC METABOLIC PANEL
Anion gap: 6 (ref 5–15)
BUN: 8 mg/dL (ref 6–20)
CO2: 27 mmol/L (ref 22–32)
Calcium: 8.8 mg/dL — ABNORMAL LOW (ref 8.9–10.3)
Chloride: 108 mmol/L (ref 101–111)
Creatinine, Ser: 0.63 mg/dL (ref 0.61–1.24)
GFR calc Af Amer: >60 mL/min (ref >60)
GFR calc non Af Amer: >60 mL/min (ref >60)
Glucose, Bld: 108 mg/dL — ABNORMAL HIGH (ref 65–99)
Potassium: 3.7 mmol/L (ref 3.5–5.1)
Sodium: 141 mmol/L (ref 135–145)

## 2015-08-22 LAB — I-STAT TROPONIN, ED: TROPONIN I, POC: 0.01 ng/mL (ref 0.00–0.08)

## 2015-08-22 LAB — CBC
HCT: 32.6 % — ABNORMAL LOW (ref 39.0–52.0)
Hemoglobin: 10.4 g/dL — ABNORMAL LOW (ref 13.0–17.0)
MCH: 27.2 pg (ref 26.0–34.0)
MCHC: 31.9 g/dL (ref 30.0–36.0)
MCV: 85.1 fL (ref 78.0–100.0)
Platelets: 218 10*3/uL (ref 150–400)
RBC: 3.83 MIL/uL — ABNORMAL LOW (ref 4.22–5.81)
RDW: 14.8 % (ref 11.5–15.5)
WBC: 6 10*3/uL (ref 4.0–10.5)

## 2015-08-22 MED ORDER — KETOROLAC TROMETHAMINE 30 MG/ML IJ SOLN
15.0000 mg | Freq: Once | INTRAMUSCULAR | Status: AC
  Administered 2015-08-22: 15 mg via INTRAVENOUS
  Filled 2015-08-22: qty 1

## 2015-08-22 MED ORDER — MORPHINE SULFATE (PF) 4 MG/ML IV SOLN
6.0000 mg | Freq: Once | INTRAVENOUS | Status: AC
  Administered 2015-08-22: 6 mg via INTRAVENOUS
  Filled 2015-08-22: qty 2

## 2015-08-22 NOTE — ED Notes (Signed)
Pt alert & oriented x4, stable gait. Patient given discharge instructions, paperwork & prescription(s). Patient  instructed to stop at the registration desk to finish any additional paperwork. Patient verbalized understanding. Pt left department w/ no further questions. 

## 2015-08-22 NOTE — ED Notes (Signed)
Intermittent chest pain since yesterday. " feels like needles sticking in left side of chest"

## 2015-08-22 NOTE — Discharge Instructions (Signed)
Nonspecific Chest Pain  °Chest pain can be caused by many different conditions. There is always a chance that your pain could be related to something serious, such as a heart attack or a blood clot in your lungs. Chest pain can also be caused by conditions that are not life-threatening. If you have chest pain, it is very important to follow up with your health care provider. °CAUSES  °Chest pain can be caused by: °· Heartburn. °· Pneumonia or bronchitis. °· Anxiety or stress. °· Inflammation around your heart (pericarditis) or lung (pleuritis or pleurisy). °· A blood clot in your lung. °· A collapsed lung (pneumothorax). It can develop suddenly on its own (spontaneous pneumothorax) or from trauma to the chest. °· Shingles infection (varicella-zoster virus). °· Heart attack. °· Damage to the bones, muscles, and cartilage that make up your chest wall. This can include: °¨ Bruised bones due to injury. °¨ Strained muscles or cartilage due to frequent or repeated coughing or overwork. °¨ Fracture to one or more ribs. °¨ Sore cartilage due to inflammation (costochondritis). °RISK FACTORS  °Risk factors for chest pain may include: °· Activities that increase your risk for trauma or injury to your chest. °· Respiratory infections or conditions that cause frequent coughing. °· Medical conditions or overeating that can cause heartburn. °· Heart disease or family history of heart disease. °· Conditions or health behaviors that increase your risk of developing a blood clot. °· Having had chicken pox (varicella zoster). °SIGNS AND SYMPTOMS °Chest pain can feel like: °· Burning or tingling on the surface of your chest or deep in your chest. °· Crushing, pressure, aching, or squeezing pain. °· Dull or sharp pain that is worse when you move, cough, or take a deep breath. °· Pain that is also felt in your back, neck, shoulder, or arm, or pain that spreads to any of these areas. °Your chest pain may come and go, or it may stay  constant. °DIAGNOSIS °Lab tests or other studies may be needed to find the cause of your pain. Your health care provider may have you take a test called an ambulatory ECG (electrocardiogram). An ECG records your heartbeat patterns at the time the test is performed. You may also have other tests, such as: °· Transthoracic echocardiogram (TTE). During echocardiography, sound waves are used to create a picture of all of the heart structures and to look at how blood flows through your heart. °· Transesophageal echocardiogram (TEE). This is a more advanced imaging test that obtains images from inside your body. It allows your health care provider to see your heart in finer detail. °· Cardiac monitoring. This allows your health care provider to monitor your heart rate and rhythm in real time. °· Holter monitor. This is a portable device that records your heartbeat and can help to diagnose abnormal heartbeats. It allows your health care provider to track your heart activity for several days, if needed. °· Stress tests. These can be done through exercise or by taking medicine that makes your heart beat more quickly. °· Blood tests. °· Imaging tests. °TREATMENT  °Your treatment depends on what is causing your chest pain. Treatment may include: °· Medicines. These may include: °¨ Acid blockers for heartburn. °¨ Anti-inflammatory medicine. °¨ Pain medicine for inflammatory conditions. °¨ Antibiotic medicine, if an infection is present. °¨ Medicines to dissolve blood clots. °¨ Medicines to treat coronary artery disease. °· Supportive care for conditions that do not require medicines. This may include: °¨ Resting. °¨ Applying heat   or cold packs to injured areas. °¨ Limiting activities until pain decreases. °HOME CARE INSTRUCTIONS °· If you were prescribed an antibiotic medicine, finish it all even if you start to feel better. °· Avoid any activities that bring on chest pain. °· Do not use any tobacco products, including  cigarettes, chewing tobacco, or electronic cigarettes. If you need help quitting, ask your health care provider. °· Do not drink alcohol. °· Take medicines only as directed by your health care provider. °· Keep all follow-up visits as directed by your health care provider. This is important. This includes any further testing if your chest pain does not go away. °· If heartburn is the cause for your chest pain, you may be told to keep your head raised (elevated) while sleeping. This reduces the chance that acid will go from your stomach into your esophagus. °· Make lifestyle changes as directed by your health care provider. These may include: °¨ Getting regular exercise. Ask your health care provider to suggest some activities that are safe for you. °¨ Eating a heart-healthy diet. A registered dietitian can help you to learn healthy eating options. °¨ Maintaining a healthy weight. °¨ Managing diabetes, if necessary. °¨ Reducing stress. °SEEK MEDICAL CARE IF: °· Your chest pain does not go away after treatment. °· You have a rash with blisters on your chest. °· You have a fever. °SEEK IMMEDIATE MEDICAL CARE IF:  °· Your chest pain is worse. °· You have an increasing cough, or you cough up blood. °· You have severe abdominal pain. °· You have severe weakness. °· You faint. °· You have chills. °· You have sudden, unexplained chest discomfort. °· You have sudden, unexplained discomfort in your arms, back, neck, or jaw. °· You have shortness of breath at any time. °· You suddenly start to sweat, or your skin gets clammy. °· You feel nauseous or you vomit. °· You suddenly feel light-headed or dizzy. °· Your heart begins to beat quickly, or it feels like it is skipping beats. °These symptoms may represent a serious problem that is an emergency. Do not wait to see if the symptoms will go away. Get medical help right away. Call your local emergency services (911 in the [place] Do not drive yourself to the hospital. °  °This  information is not intended to replace advice given to you by your health care provider. Make sure you discuss any questions you have with your health care provider. °  °Document Released: 06/09/2005 Document Revised: 09/20/2014 Document Reviewed: 04/05/2014 °[employer]vier Interactive Patient Education ©2016 [employer] ° °

## 2015-08-22 NOTE — ED Provider Notes (Signed)
CSN: [id number]     Arrival date & time 08/22/15  2117 History  By signing my name below, I, Rayna Sexton, attest that this documentation has been prepared under the direction and in the presence of Virgel Manifold, MD. Electronically Signed: Rayna Sexton, ED Scribe. 08/22/2015. 10:25 PM.   Chief Complaint  Patient presents with  . Chest Pain   The history is provided by the patient. No language interpreter was used.    HPI Comments: Nathan Kane is a 49 y.o. male who presents to the Emergency Department complaining of intermittent, mild, left sided CP with onset some time ago and worsening symptoms beginning last night. He notes a mild alleviation of his pain when lying back or ambulating and worsening when bending over further noting that it feels "sharp". He notes that each episode lasts about 30 minutes. He notes some mild rhinorrhea, fatigue, subjective fever (98.5 F, triage) and diffuse body aches. He denies taking any medication for pain management. Pt notes a hx of left sided rib fracture. He notes a hx of HLD and denies taking medication for his symptoms. Pt denies a hx of smoking, CA, DM or having ever had a stress test conducted in the past. Pt denies any known drug allergies. He denies SOB, chills, nausea and leg swelling.   Past Medical History  Diagnosis Date  . Hyperlipemia    Past Surgical History  Procedure Laterality Date  . Knee arthroscopy      left   No family history on file. Social History  Substance Use Topics  . Smoking status: Never Smoker   . Smokeless tobacco: None  . Alcohol Use: 2.4 oz/week    4 Cans of beer per week     Comment: 4 to 5 days a week    Review of Systems  Constitutional: Positive for fever (subjective) and fatigue. Negative for chills.  HENT: Positive for rhinorrhea.   Respiratory: Negative for shortness of breath.   Cardiovascular: Positive for chest pain. Negative for leg swelling.  Gastrointestinal: Negative for  nausea.  Musculoskeletal: Positive for myalgias.  All other systems reviewed and are negative.  Allergies  Review of patient's allergies indicates no known allergies.  Home Medications   Prior to Admission medications   Medication Sig Start Date End Date Taking? Authorizing Provider  HYDROcodone-acetaminophen (NORCO) 5-325 MG per tablet Take 1-2 tablets by mouth every 4 (four) hours as needed for pain. 06/24/12   Lisette Abu, PA-C  naproxen (NAPROSYN) 500 MG tablet Take 1 tablet (500 mg total) by mouth 2 (two) times daily with a meal. 06/24/12   Lisette Abu, PA-C  oxyCODONE-acetaminophen (PERCOCET/ROXICET) 5-325 MG per tablet 1-2 tablets po q 6 hours prn moderate to severe pain 06/23/12   Kingsley Spittle, MD   BP 129/87 mmHg  Pulse 97  Temp(Src) 98.5 F (36.9 C) (Oral)  Resp 12  Ht 5\' 8"  (1.727 m)  Wt 180 lb (81.647 kg)  BMI 27.38 kg/m2  SpO2 97% Physical Exam  Constitutional: He is oriented to person, place, and time. He appears well-developed and well-nourished.  HENT:  Head: Normocephalic and atraumatic.  Eyes: EOM are normal.  Neck: Normal range of motion.  Cardiovascular: Normal rate, regular rhythm, normal heart sounds and intact distal pulses.   Pulmonary/Chest: Effort normal and breath sounds normal. No respiratory distress. He exhibits no tenderness.  Mild tachycardia   Abdominal: Soft. He exhibits no distension. There is no tenderness.  Musculoskeletal: Normal range of motion.  Neurological: He  is alert and oriented to person, place, and time.  Skin: Skin is warm and dry.  Psychiatric: He has a normal mood and affect. Judgment normal.  Nursing note and vitals reviewed.  ED Course  Procedures  COORDINATION OF CARE: 10:24 PM Discussed next steps with pt and he agreed to plan.   Labs Review Labs Reviewed  BASIC METABOLIC PANEL - Abnormal; Notable for the following:    Glucose, Bld 108 (*)    Calcium 8.8 (*)    All other components within normal limits   CBC - Abnormal; Notable for the following:    RBC 3.83 (*)    Hemoglobin 10.4 (*)    HCT 32.6 (*)    All other components within normal limits  I-STAT TROPOININ, ED    Imaging Review Dg Chest 2 View  08/22/2015  CLINICAL DATA:  Left side chest pain, productive cough EXAM: CHEST  2 VIEW COMPARISON:  06/24/2012 FINDINGS: Cardiomediastinal silhouette is stable. Old left rib fractures are again noted. No acute infiltrate or pleural effusion. No pulmonary edema. IMPRESSION: No active cardiopulmonary disease. Electronically Signed   By: Lahoma Crocker M.D.   On: 08/22/2015 22:37    I have personally reviewed and evaluated these images and lab results as part of my medical decision-making.   EKG Interpretation   Date/Time:  Friday August 22 2015 21:27:03 EST Ventricular Rate:  98 PR Interval:  137 QRS Duration: 121 QT Interval:  337 QTC Calculation: 430 R Axis:   71 Text Interpretation:  Sinus rhythm Probable left atrial enlargement  Nonspecific intraventricular conduction delay Confirmed by Porter Nakama  MD,  Stephania Macfarlane ([id number]) on 08/22/2015 11:00:33 PM      MDM   Final diagnoses:  Atypical chest pain   I personally preformed the services scribed in my presence. The recorded information has been reviewed is accurate. Virgel Manifold, MD.    Virgel Manifold, MD 08/25/15 [phone]

## 2016-07-22 ENCOUNTER — Encounter (HOSPITAL_COMMUNITY): Payer: Self-pay | Admitting: Emergency Medicine

## 2016-07-22 ENCOUNTER — Ambulatory Visit (HOSPITAL_COMMUNITY)
Admission: EM | Admit: 2016-07-22 | Discharge: 2016-07-22 | Disposition: A | Discharge status: Home / Self Care | Payer: BLUE CROSS/BLUE SHIELD | Attending: Family Medicine | Admitting: Family Medicine

## 2016-07-22 DIAGNOSIS — N433 Hydrocele, unspecified: Secondary | ICD-10-CM

## 2016-07-22 LAB — POCT URINALYSIS DIP (DEVICE)
BILIRUBIN URINE: NEGATIVE
Glucose, UA: NEGATIVE mg/dL
Hgb urine dipstick: NEGATIVE
KETONES UR: NEGATIVE mg/dL
Leukocytes, UA: NEGATIVE
Nitrite: NEGATIVE
PH: 5.5 (ref 5.0–8.0)
PROTEIN: NEGATIVE mg/dL
SPECIFIC GRAVITY, URINE: 1.01 (ref 1.005–1.030)
Urobilinogen, UA: 0.2 mg/dL (ref 0.0–1.0)

## 2016-07-22 NOTE — Discharge Instructions (Signed)
Please call the urology office to schedule a follow up appointment today.

## 2016-07-22 NOTE — ED Notes (Signed)
Pt d/c by Dr. Bonner Puna

## 2016-07-22 NOTE — ED Provider Notes (Signed)
[place]  CSN: [id number] Arrival date & time: 07/22/16  1159  History   Chief Complaint Chief Complaint  Patient presents with  . Penis Pain   HPI Nathan Kane is a 50 y.o. male presenting to urgent care for 3 months of constant, worsening left testicular pain and scrotal swelling.   He reports left testicular pain intermittently radiating into the penis sometimes worse with sex. No penile bleeding, discharge, but occasional burning with urination. No incomplete emptying, stream interruption, straining at urine or urinary urgency/frequency. No trauma. During this time his scrotum has felt more full without history of trauma. He denies h/o STI's, UTI's, or torsion. No fever, abdominal pain.    Past Medical History:  Diagnosis Date  . Hyperlipemia     Patient Active Problem List   Diagnosis Date Noted  . Fall 06/24/2012  . Multiple fractures of ribs of left side 06/24/2012  . Lumbar transverse process fracture ([place]) 06/24/2012    Past Surgical History:  Procedure Laterality Date  . KNEE ARTHROSCOPY     left   Home Medications    Prior to Admission medications   Not on File   Family History No family history on file.  Social History Social History  Substance Use Topics  . Smoking status: Never Smoker  . Smokeless tobacco: Never Used  . Alcohol use 2.4 oz/week    4 Cans of beer per week     Comment: 4 to 5 days a week     Allergies   Patient has no known allergies.   Review of Systems Review of Systems As above  Physical Exam Triage Vital Signs ED Triage Vitals  Enc Vitals Group     BP 07/22/16 1212 124/77     Pulse Rate 07/22/16 1212 78     Resp 07/22/16 1212 16     Temp 07/22/16 1212 98.4 F (36.9 C)     Temp Source 07/22/16 1212 Oral     SpO2 07/22/16 1212 98 %     Weight --      Height --      Head Circumference --      Peak Flow --      Pain Score 07/22/16 1216 7     Pain Loc --      Pain Edu? --      Excl. in [place]? --     No data found.   Updated Vital Signs BP 124/77 (BP Location: Left Arm)   Pulse 78   Temp 98.4 F (36.9 C) (Oral)   Resp 16   SpO2 98%   Physical Exam  Constitutional: He is oriented to person, place, and time. He appears well-developed and well-nourished. No distress.  Eyes: EOM are normal. Pupils are equal, round, and reactive to light. No scleral icterus.  Neck: Neck supple. No JVD present.  Cardiovascular: Normal rate, regular rhythm, normal heart sounds and intact distal pulses.   No murmur heard. Pulmonary/Chest: Effort normal and breath sounds normal. No respiratory distress.  Abdominal: Soft. Bowel sounds are normal. He exhibits no distension. There is no tenderness.  Genitourinary:  Genitourinary Comments: Normal uncircumcised penis without lesions or irritation.   Hydrocele noted without varicosities. Testicles without tenderness or palpable abnormalities. No epididymal tenderness. No erythema.   Musculoskeletal: Normal range of motion. He exhibits no edema or tenderness.  Lymphadenopathy:    He has no cervical adenopathy.  Neurological: He is alert and oriented to person, place, and time. He exhibits normal muscle  tone.  Skin: Skin is warm and dry.  Vitals reviewed.  UC Treatments / Results  Labs (all labs ordered are listed, but only abnormal results are displayed) Labs Reviewed  POCT URINALYSIS DIP (DEVICE)    EKG  EKG Interpretation None       Radiology No results found.  Procedures Procedures (including critical care time)  Medications Ordered in UC Medications - No data to display   Initial Impression / Assessment and Plan / UC Course  I have reviewed the triage vital signs and the nursing notes.  Pertinent labs & imaging results that were available during my care of the patient were reviewed by me and considered in my medical decision making (see chart for details).  Final Clinical Impressions(s) / UC Diagnoses   Final diagnoses:    Hydrocele in adult   50 y.o. male presenting for subacute left testicle pain with hydrocele on exam. No h/o UTI's, STI's or torsion and no suspicion for these today with negative U/A.  - Referral to urology for further evaluation - No indication for urgent U/S or antibiotic treatment at this time - Return precautions reviewed    Patrecia Pour, MD 07/22/16 1250

## 2016-07-22 NOTE — ED Triage Notes (Signed)
Here for penile pain and left testicular pain onset 3 months associated w/dysuria, erection dysfunction  Denies fevers, chills, penile d/c, inj/trauma, hematuria,   Family hx of prostate cancer.   A&O x4... NAD

## 2017-01-18 ENCOUNTER — Encounter (HOSPITAL_COMMUNITY): Payer: Self-pay | Admitting: Emergency Medicine

## 2017-01-18 ENCOUNTER — Ambulatory Visit (HOSPITAL_COMMUNITY)
Admission: EM | Admit: 2017-01-18 | Discharge: 2017-01-18 | Disposition: A | Discharge status: Home / Self Care | Payer: BLUE CROSS/BLUE SHIELD

## 2017-01-18 DIAGNOSIS — R030 Elevated blood-pressure reading, without diagnosis of hypertension: Secondary | ICD-10-CM | POA: Diagnosis not present

## 2017-01-18 DIAGNOSIS — M79671 Pain in right foot: Secondary | ICD-10-CM | POA: Diagnosis not present

## 2017-01-18 DIAGNOSIS — M79672 Pain in left foot: Secondary | ICD-10-CM | POA: Diagnosis not present

## 2017-01-18 DIAGNOSIS — B070 Plantar wart: Secondary | ICD-10-CM | POA: Diagnosis not present

## 2017-01-18 NOTE — ED Triage Notes (Signed)
Pt states he stepped on broken glass from a mirror with both feet last Thursday.  Pt does not have any lacerations or open wounds on his feet, but he reports pain in both feet in several areas.

## 2017-01-18 NOTE — ED Provider Notes (Signed)
CSN: [id number]     Arrival date & time 01/18/17  1113 History   None    Chief Complaint  Patient presents with  . Foot Pain    bilateral   (Consider location/radiation/quality/duration/timing/severity/associated sxs/prior Treatment) 51 yr old Hispanic male presents to UC with cc of foot pain x 5 days, states stepped on broken glass, pt denies any cuts or lacerations, fever, bleeding, or purulent drainage.    The history is provided by the patient. No language interpreter was used.    Past Medical History:  Diagnosis Date  . Hyperlipemia    Past Surgical History:  Procedure Laterality Date  . KNEE ARTHROSCOPY     left   History reviewed. No pertinent family history. Social History  Substance Use Topics  . Smoking status: Never Smoker  . Smokeless tobacco: Never Used  . Alcohol use 2.4 oz/week    4 Cans of beer per week     Comment: 4 to 5 days a week    Review of Systems  Constitutional: Negative for activity change, appetite change and fever.  HENT: Negative.   Respiratory: Negative.  Negative for shortness of breath.   Cardiovascular: Negative for chest pain.  Gastrointestinal: Negative for abdominal pain.  Endocrine: Negative.   Genitourinary: Negative for dysuria.  Musculoskeletal: Positive for myalgias. Negative for arthralgias, gait problem and joint swelling.  Skin: Negative for color change and wound.  Allergic/Immunologic: Negative.   Neurological: Negative for headaches.  Hematological: Negative.   Psychiatric/Behavioral: Negative.   All other systems reviewed and are negative.   Allergies  Patient has no known allergies.  Home Medications   Prior to Admission medications   Not on File   Meds Ordered and Administered this Visit  Medications - No data to display  BP 121/80 (BP Location: Left Arm)   Pulse 78   Temp 97.9 F (36.6 C) (Oral)   SpO2 98%  No data found.   Physical Exam  Constitutional: He is oriented to person, place, and time.  He appears well-developed and well-nourished. He is active and cooperative.  Non-toxic appearance. He does not have a sickly appearance. He does not appear ill. No distress.  HENT:  Head: Normocephalic.  Right Ear: Tympanic membrane normal.  Left Ear: Tympanic membrane normal.  Nose: Nose normal.  Mouth/Throat: Uvula is midline and mucous membranes are normal.  Eyes: Pupils are equal, round, and reactive to light.  Neck: Trachea normal and normal range of motion. Muscular tenderness present. No Brudzinski's sign and no Kernig's sign noted.  Cardiovascular: Normal rate and regular rhythm.   Pulses:      Dorsalis pedis pulses are 2+ on the right side, and 2+ on the left side.  Pulmonary/Chest: Effort normal and breath sounds normal.  Musculoskeletal:       Right shoulder: He exhibits normal range of motion, no tenderness, no swelling, no effusion, no crepitus, no deformity, no laceration, no pain, normal pulse and normal strength.  Neurological: He is alert and oriented to person, place, and time. No cranial nerve deficit or sensory deficit. Gait normal. GCS eye subscore is 4. GCS verbal subscore is 5. GCS motor subscore is 6.  Skin: Skin is warm and dry. Lesion noted. No rash noted.  Bilateral plantar warts noted, no abrasions or foreign bodies seen  Psychiatric: He has a normal mood and affect. His speech is normal and behavior is normal.  Nursing note and vitals reviewed.   Urgent Care Course     Procedures (  including critical care time)  Labs Review Labs Reviewed - No data to display  Imaging Review No results found.      MDM   1. Bilateral foot pain   2. Plantar warts   3. Elevated blood pressure reading     Discussed exam and plan of care to pt: Xray will not pick up broken glass. No visual foreign bodies seen. Please follow up with [facility] or PCP of your choice to have bp rechecked as it was slightly elevated in office today. Please follow up with  podiatrist of your choice regarding plantar warts. Return to UC as needed. Pt verbalized understanding to this provider.   Tori Milks, NP 75/[phone]

## 2017-01-18 NOTE — ED Notes (Signed)
Advised pt of discharge up coming

## 2017-01-18 NOTE — Discharge Instructions (Signed)
Xray will not pick up broken glass. No visual foreign bodies seen. Please follow up with [facility] or PCP of your choice to have bp rechecked as it was slightly elevated in office today. Please follow up with podiatrist of your choice regarding plantar warts. Return to UC as needed.

## 2017-02-15 ENCOUNTER — Emergency Department (HOSPITAL_COMMUNITY)
Admission: EM | Admit: 2017-02-15 | Discharge: 2017-02-15 | Disposition: A | Discharge status: Home / Self Care | Payer: BLUE CROSS/BLUE SHIELD | Attending: Emergency Medicine | Admitting: Emergency Medicine

## 2017-02-15 ENCOUNTER — Encounter (HOSPITAL_COMMUNITY): Payer: Self-pay | Admitting: *Deleted

## 2017-02-15 ENCOUNTER — Emergency Department (HOSPITAL_COMMUNITY): Payer: BLUE CROSS/BLUE SHIELD

## 2017-02-15 DIAGNOSIS — Y939 Activity, unspecified: Secondary | ICD-10-CM | POA: Diagnosis not present

## 2017-02-15 DIAGNOSIS — Y999 Unspecified external cause status: Secondary | ICD-10-CM | POA: Insufficient documentation

## 2017-02-15 DIAGNOSIS — Y929 Unspecified place or not applicable: Secondary | ICD-10-CM | POA: Insufficient documentation

## 2017-02-15 DIAGNOSIS — M79672 Pain in left foot: Secondary | ICD-10-CM

## 2017-02-15 DIAGNOSIS — M79671 Pain in right foot: Secondary | ICD-10-CM | POA: Insufficient documentation

## 2017-02-15 DIAGNOSIS — W25XXXA Contact with sharp glass, initial encounter: Secondary | ICD-10-CM | POA: Diagnosis not present

## 2017-02-15 NOTE — ED Triage Notes (Signed)
To ED for eval of pain to the bottom of both feet for the past month. Pt states he stepped on glass. Assessed the bottom of feet and see no obvious injury. Pt states no pain when not walking.

## 2017-02-15 NOTE — ED Notes (Signed)
Pt states he was seen at [facility] and they told him they couldn't do anything. Pt states, "I can feel the glass in my feet".

## 2017-02-15 NOTE — Discharge Instructions (Signed)
Please read instructions below. You can take advil every 6 hours as needed for pain. Schedule an appointment with primary care in 1 week for follow-up on your ongoing pain. Return to the ER for new or concerning symptoms.

## 2017-02-15 NOTE — ED Provider Notes (Signed)
[place] DEPT Provider Note   CSN: [id number] Arrival date & time: 02/15/17  1204  By signing my name below, I, Levester Fresh, attest that this documentation has been prepared under the direction and in the presence of Martinique Russo, PA-C. Electronically Signed: Levester Fresh, Scribe. 02/15/2017. 5:19 PM.  History   Chief Complaint Chief Complaint  Patient presents with  . Foot Pain   HPI Comments Nathan Kane is a 51 y.o. male with a PMHx significant for bilateral foot pain and plantar warts, who presents to the Emergency Department with complaints of constant bilateral foot pain x1 month.  This pain is worse when walking or weight bearing.  Pt reports that he stepped on broken glass x1 month ago, and was evaluated by Urgent Care.  At the time of injury, states he had laceration.  Plantar warts were noted on his exam, for which he was instructed to f/u with a podiatrist.  No reported hx of DM.  The history is provided by the patient and medical records. No language interpreter was used.   Past Medical History:  Diagnosis Date  . Hyperlipemia     Patient Active Problem List   Diagnosis Date Noted  . Bilateral foot pain 01/18/2017  . Plantar warts 01/18/2017  . Elevated blood pressure reading 01/18/2017  . Fall 06/24/2012  . Multiple fractures of ribs of left side 06/24/2012  . Lumbar transverse process fracture ([place]) 06/24/2012    Past Surgical History:  Procedure Laterality Date  . KNEE ARTHROSCOPY     left       Home Medications    Prior to Admission medications   Not on File    Family History No family history on file.  Social History Social History  Substance Use Topics  . Smoking status: Never Smoker  . Smokeless tobacco: Never Used  . Alcohol use 2.4 oz/week    4 Cans of beer per week     Comment: 4 to 5 days a week     Allergies   Patient has no known allergies.   Review of Systems Review of Systems  Constitutional: Negative  for chills and fever.  Gastrointestinal: Negative for nausea and vomiting.  Musculoskeletal: Negative for arthralgias, back pain, gait problem, joint swelling and myalgias.       Foot pain  Skin: Negative for wound.   Physical Exam Updated Vital Signs BP 129/70 (BP Location: Right Arm)   Pulse 88   Temp 97.8 F (36.6 C) (Oral)   Resp 16   SpO2 98%   Physical Exam  Constitutional: He appears well-developed and well-nourished.  HENT:  Head: Normocephalic and atraumatic.  Eyes: Conjunctivae are normal.  Pulmonary/Chest: Effort normal.  Musculoskeletal:  Intact DP pulses bilaterally.  Tenderness to multiple sites on the plantar aspect of both feet. No lacerations, edema or color change noted.  Normal range of motion to bilateral ankles and toes.  Sensation intact.  Skin: Capillary refill takes less than 2 seconds.  Psychiatric: He has a normal mood and affect. His behavior is normal.  Nursing note and vitals reviewed.  ED Treatments / Results  DIAGNOSTIC STUDIES: Oxygen Saturation is 99% on room air, normal by my interpretation.    COORDINATION OF CARE: 1:16 PM Discussed treatment plan with pt at bedside and pt agreed to plan.  Labs (all labs ordered are listed, but only abnormal results are displayed) Labs Reviewed - No data to display  EKG  EKG Interpretation None  Radiology Dg Foot Complete Left  Result Date: 02/15/2017 CLINICAL DATA:  Stepped on glass 1 month ago EXAM: LEFT FOOT - COMPLETE 3+ VIEW COMPARISON:  None. FINDINGS: No fracture or malalignment. Mild degenerative changes at the first MTP joint. Small plantar calcaneal spurring. No definite radiopaque foreign body. IMPRESSION: No acute osseous abnormality. No definite radiopaque foreign body seen. Electronically Signed   By: Donavan Foil M.D.   On: 02/15/2017 14:24   Dg Foot Complete Right  Result Date: 02/15/2017 CLINICAL DATA:  Stepped on glass 1 month ago EXAM: RIGHT FOOT COMPLETE - 3+ VIEW COMPARISON:   None. FINDINGS: No fracture or malalignment. Mild degenerative changes at the first MTP joint. No definite radiopaque foreign body. IMPRESSION: No definite radiopaque foreign body visualized. Electronically Signed   By: Donavan Foil M.D.   On: 02/15/2017 14:22    Procedures Procedures (including critical care time)  Medications Ordered in ED Medications - No data to display  Initial Impression / Assessment and Plan / ED Course  I have reviewed the triage vital signs and the nursing notes.  Pertinent labs & imaging results that were available during my care of the patient were reviewed by me and considered in my medical decision making (see chart for details).  Patient w b/l foot pain and foreign body sensation. X-Ray negative for obvious foreign bodies, fracture, dislocation. Pt advised to follow up with PCP. Conservative therapy recommended and discussed. Patient will be discharged home & is agreeable with above plan. Returns precautions discussed. Pt appears safe for discharge.     Discussed results, findings, treatment and follow up. Patient advised of return precautions. Patient verbalized understanding and agreed with plan.  I personally performed the services described in this documentation, which was scribed in my presence. The recorded information has been reviewed and is accurate.  Final Clinical Impressions(s) / ED Diagnoses   Final diagnoses:  Right foot pain  Left foot pain    New Prescriptions There are no discharge medications for this patient.    Russo, [place] N, PA-C 02/15/17 1719    Pattricia Boss, MD 02/19/17 [phone]

## 2017-02-15 NOTE — ED Notes (Signed)
Pt is in stable condition upon d/c and ambulates from ED. 

## 2017-03-25 ENCOUNTER — Ambulatory Visit (HOSPITAL_COMMUNITY)
Admission: EM | Admit: 2017-03-25 | Discharge: 2017-03-25 | Disposition: A | Discharge status: Home / Self Care | Payer: BLUE CROSS/BLUE SHIELD | Attending: Internal Medicine | Admitting: Internal Medicine

## 2017-03-25 ENCOUNTER — Encounter (HOSPITAL_COMMUNITY): Payer: Self-pay | Admitting: Emergency Medicine

## 2017-03-25 DIAGNOSIS — L02411 Cutaneous abscess of right axilla: Secondary | ICD-10-CM | POA: Diagnosis not present

## 2017-03-25 MED ORDER — HYDROCODONE-ACETAMINOPHEN 5-325 MG PO TABS: 1.0000 | ORAL_TABLET | ORAL | 0 refills | Status: DC | PRN

## 2017-03-25 MED ORDER — DOXYCYCLINE HYCLATE 100 MG PO TABS
100.0000 mg | ORAL_TABLET | Freq: Once | ORAL | Status: AC
  Administered 2017-03-25: 100 mg via ORAL

## 2017-03-25 MED ORDER — CEFTRIAXONE SODIUM 1 G IJ SOLR
INTRAMUSCULAR | Status: AC
  Filled 2017-03-25: qty 10

## 2017-03-25 MED ORDER — LIDOCAINE HCL (PF) 1 % IJ SOLN
INTRAMUSCULAR | Status: AC
  Filled 2017-03-25: qty 2

## 2017-03-25 MED ORDER — HYDROCODONE-ACETAMINOPHEN 5-325 MG PO TABS
1.0000 | ORAL_TABLET | Freq: Once | ORAL | Status: AC
  Administered 2017-03-25: 1 via ORAL

## 2017-03-25 MED ORDER — HYDROCODONE-ACETAMINOPHEN 5-325 MG PO TABS
ORAL_TABLET | ORAL | Status: AC
  Filled 2017-03-25: qty 1

## 2017-03-25 MED ORDER — DOXYCYCLINE HYCLATE 100 MG PO CAPS: 100.0000 mg | ORAL_CAPSULE | Freq: Two times a day (BID) | ORAL | 0 refills | Status: DC

## 2017-03-25 MED ORDER — CEFTRIAXONE SODIUM 1 G IJ SOLR
1.0000 g | Freq: Once | INTRAMUSCULAR | Status: AC
  Administered 2017-03-25: 1 g via INTRAMUSCULAR

## 2017-03-25 MED ORDER — DOXYCYCLINE HYCLATE 100 MG PO TABS
ORAL_TABLET | ORAL | Status: AC
  Filled 2017-03-25: qty 1

## 2017-03-25 NOTE — ED Provider Notes (Signed)
CSN: [id number]     Arrival date & time 03/25/17  1644 History   None    Chief Complaint  Patient presents with  . Abscess   (Consider location/radiation/quality/duration/timing/severity/associated sxs/prior Treatment) The history is provided by the patient.  Abscess  Location:  Torso Torso abscess location:  R axilla Size:  3 cm Abscess quality: draining, painful, redness and warmth   Red streaking: no   Duration:  1 week Progression:  Worsening Pain details:    Quality:  Throbbing   Severity:  Severe   Duration:  1 week   Timing:  Constant   Progression:  Worsening Chronicity:  New Context: not diabetes and not skin injury   Relieved by:  Nothing Worsened by:  Draining/squeezing Ineffective treatments:  None tried Associated symptoms: no anorexia, no fatigue, no fever, no nausea and no vomiting   Risk factors: no prior abscess     Past Medical History:  Diagnosis Date  . Hyperlipemia    Past Surgical History:  Procedure Laterality Date  . KNEE ARTHROSCOPY     left   History reviewed. No pertinent family history. Social History  Substance Use Topics  . Smoking status: Never Smoker  . Smokeless tobacco: Never Used  . Alcohol use 2.4 oz/week    4 Cans of beer per week     Comment: 4 to 5 days a week    Review of Systems  Constitutional: Negative for fatigue and fever.  HENT: Negative.   Respiratory: Negative.   Cardiovascular: Negative.   Gastrointestinal: Negative for anorexia, nausea and vomiting.  Musculoskeletal: Negative.   Skin:       Skin abscess right axilla  Neurological: Negative.     Allergies  Patient has no known allergies.  Home Medications   Prior to Admission medications   Medication Sig Start Date End Date Taking? Authorizing Provider  doxycycline (VIBRAMYCIN) 100 MG capsule Take 1 capsule (100 mg total) by mouth 2 (two) times daily. 03/25/17   Barnet Glasgow, NP  HYDROcodone-acetaminophen (NORCO/VICODIN) 5-325 MG tablet Take 1  tablet by mouth every 4 (four) hours as needed. 03/25/17   Barnet Glasgow, NP   Meds Ordered and Administered this Visit   Medications  HYDROcodone-acetaminophen (NORCO/VICODIN) 5-325 MG per tablet 1 tablet (1 tablet Oral Given 03/25/17 1834)  cefTRIAXone (ROCEPHIN) injection 1 g (1 g Intramuscular Given 03/25/17 1834)  doxycycline (VIBRA-TABS) tablet 100 mg (100 mg Oral Given 03/25/17 1834)    BP 119/73 (BP Location: Right Arm)   Pulse 84   Temp 98.8 F (37.1 C) (Oral)   Resp 18   SpO2 98%  No data found.   Physical Exam  Constitutional: He is oriented to person, place, and time. He appears well-developed and well-nourished. No distress.  HENT:  Head: Normocephalic and atraumatic.  Right Ear: External ear normal.  Left Ear: External ear normal.  Neck: Normal range of motion.  Cardiovascular: Normal rate and regular rhythm.   Pulmonary/Chest: Effort normal and breath sounds normal.  Neurological: He is alert and oriented to person, place, and time.  Skin: Skin is warm and dry. Capillary refill takes less than 2 seconds. He is not diaphoretic.  Skin abscess right axilla open, draining purulent material  Psychiatric: He has a normal mood and affect. His behavior is normal.  Nursing note and vitals reviewed.   Urgent Care Course     Procedures (including critical care time)  Labs Review Labs Reviewed - No data to display  Imaging Review No results  found.      MDM   1. Abscess of axilla, right     I&D deferred at this time as the abscess is already open and draining. Purulent material was manually expressed, given injection of Rocephin in clinic, started on doxycycline and hydrocodone for pain. Return to clinic as needed.  [place] controlled substances reporting system consulted prior to issuing prescription, with the following findings below:  No prescriptions for controlled substances have been written in the last 6 months.     Barnet Glasgow,  NP 03/25/17 2057

## 2017-03-25 NOTE — Discharge Instructions (Signed)
Since your abscess is open and draining, we do not have to make an incision today. You've been given antibiotic here in clinic, and I'm starting on a second antibiotic to take at home. Take one tablet twice a day. Try to squeeze and express fluid out of the abscess when possible. Keep the area clean, dry, covered.  I have also prescribed a medicine for pain called hydrocodone, this medicine is a narcotic, it will cause drowsiness, and it is addictive. Do not take more than what is necessary, do not drink alcohol while taking, and do not operate any heavy machinery while taking this medicine.   If your symptoms persist or fail to resolve within one week return to clinic as needed.

## 2017-03-25 NOTE — ED Triage Notes (Signed)
Here for abscess on right axilla onset 1 weeks that is getting bigger and more painful associated w/drainage  Denies fevers, chills  A&O x4... NAD.Marland Kitchen. ambulatory

## 2017-03-29 ENCOUNTER — Ambulatory Visit (INDEPENDENT_AMBULATORY_CARE_PROVIDER_SITE_OTHER): Payer: BLUE CROSS/BLUE SHIELD | Admitting: Orthopaedic Surgery

## 2017-03-29 ENCOUNTER — Encounter (INDEPENDENT_AMBULATORY_CARE_PROVIDER_SITE_OTHER): Payer: Self-pay | Admitting: Orthopaedic Surgery

## 2017-03-29 DIAGNOSIS — M79672 Pain in left foot: Secondary | ICD-10-CM

## 2017-03-29 DIAGNOSIS — M79671 Pain in right foot: Secondary | ICD-10-CM

## 2017-03-29 NOTE — Progress Notes (Signed)
   Office Visit Note   Patient: Nathan Kane           Date of Birth: 10-02-1965           MRN: [id number] Visit Date: 03/29/2017              Requested by: No referring provider defined for this encounter. PCP: Patient, No Pcp Per   Assessment & Plan: Visit Diagnoses:  1. Pain in both feet     Plan: CT scan of bilateral feet ordered to rule out retained foreign body. Follow-up after the CT scan.  Follow-Up Instructions: Return in about 1 week (around 04/05/2017).   Orders:  Orders Placed This Encounter  Procedures  . CT FOOT LEFT WO CONTRAST  . CT FOOT RIGHT WO CONTRAST   No orders of the defined types were placed in this encounter.     Procedures: No procedures performed   Clinical Data: No additional findings.   Subjective: Chief Complaint  Patient presents with  . Right Foot - Pain  . Left Foot - Pain    Patient is a 51 year old gentleman who stepped on glass about 2 months ago has had continued pain. He's been taking the ED multiple times and x-rays of the negative. He states that he still has a foreign body sensation in both feet. He says the pain is worse with standing and is localized over the metatarsal heads. He denies any numbness or tingling.    Review of Systems  Constitutional: Negative.   All other systems reviewed and are negative.    Objective: Vital Signs: There were no vitals taken for this visit.  Physical Exam  Constitutional: He is oriented to person, place, and time. He appears well-developed and well-nourished.  HENT:  Head: Normocephalic and atraumatic.  Eyes: Pupils are equal, round, and reactive to light.  Neck: Neck supple.  Pulmonary/Chest: Effort normal.  Abdominal: Soft.  Musculoskeletal: Normal range of motion.  Neurological: He is alert and oriented to person, place, and time.  Skin: Skin is warm.  Psychiatric: He has a normal mood and affect. His behavior is normal. Judgment and thought content normal.    Nursing note and vitals reviewed.   Ortho Exam Bilateral feet exam shows no significant swelling. Traumatic scars are well-healed. He is tender over the metatarsal heads. I do not feel any foreign objects. Specialty Comments:  No specialty comments available.  Imaging: No results found.   PMFS History: Patient Active Problem List   Diagnosis Date Noted  . Bilateral foot pain 01/18/2017  . Plantar warts 01/18/2017  . Elevated blood pressure reading 01/18/2017  . Fall 06/24/2012  . Multiple fractures of ribs of left side 06/24/2012  . Lumbar transverse process fracture ([place]) 06/24/2012   Past Medical History:  Diagnosis Date  . Hyperlipemia     No family history on file.  Past Surgical History:  Procedure Laterality Date  . KNEE ARTHROSCOPY     left   Social History   Occupational History  . Not on file.   Social History Main Topics  . Smoking status: Never Smoker  . Smokeless tobacco: Never Used  . Alcohol use 2.4 oz/week    4 Cans of beer per week     Comment: 4 to 5 days a week  . Drug use: No  . Sexual activity: Not on file

## 2017-04-05 ENCOUNTER — Ambulatory Visit
Admission: RE | Admit: 2017-04-05 | Discharge: 2017-04-05 | Disposition: A | Discharge status: Home / Self Care | Payer: BLUE CROSS/BLUE SHIELD | Source: Ambulatory Visit | Attending: Orthopaedic Surgery | Admitting: Orthopaedic Surgery

## 2017-04-05 DIAGNOSIS — M79671 Pain in right foot: Secondary | ICD-10-CM

## 2017-04-05 DIAGNOSIS — M79672 Pain in left foot: Principal | ICD-10-CM

## 2017-05-27 ENCOUNTER — Emergency Department (HOSPITAL_COMMUNITY): Payer: BLUE CROSS/BLUE SHIELD | Admitting: Anesthesiology

## 2017-05-27 ENCOUNTER — Encounter (HOSPITAL_COMMUNITY): Payer: Self-pay | Admitting: Emergency Medicine

## 2017-05-27 ENCOUNTER — Emergency Department (HOSPITAL_COMMUNITY): Payer: BLUE CROSS/BLUE SHIELD

## 2017-05-27 ENCOUNTER — Encounter (HOSPITAL_COMMUNITY)
Admission: EM | Disposition: A | Discharge status: Home / Self Care | Payer: Self-pay | Source: Home / Self Care | Attending: Emergency Medicine

## 2017-05-27 ENCOUNTER — Ambulatory Visit (HOSPITAL_COMMUNITY)
Admission: EM | Admit: 2017-05-27 | Discharge: 2017-05-27 | Disposition: A | Discharge status: Home / Self Care | Payer: BLUE CROSS/BLUE SHIELD | Attending: Emergency Medicine | Admitting: Emergency Medicine

## 2017-05-27 DIAGNOSIS — E785 Hyperlipidemia, unspecified: Secondary | ICD-10-CM | POA: Insufficient documentation

## 2017-05-27 DIAGNOSIS — S52502A Unspecified fracture of the lower end of left radius, initial encounter for closed fracture: Secondary | ICD-10-CM | POA: Diagnosis present

## 2017-05-27 DIAGNOSIS — W11XXXA Fall on and from ladder, initial encounter: Secondary | ICD-10-CM | POA: Diagnosis not present

## 2017-05-27 DIAGNOSIS — I10 Essential (primary) hypertension: Secondary | ICD-10-CM | POA: Diagnosis not present

## 2017-05-27 DIAGNOSIS — Y9389 Activity, other specified: Secondary | ICD-10-CM | POA: Insufficient documentation

## 2017-05-27 DIAGNOSIS — S52572A Other intraarticular fracture of lower end of left radius, initial encounter for closed fracture: Secondary | ICD-10-CM | POA: Insufficient documentation

## 2017-05-27 DIAGNOSIS — S52615A Nondisplaced fracture of left ulna styloid process, initial encounter for closed fracture: Secondary | ICD-10-CM | POA: Diagnosis present

## 2017-05-27 DIAGNOSIS — B070 Plantar wart: Secondary | ICD-10-CM | POA: Diagnosis not present

## 2017-05-27 DIAGNOSIS — Y92008 Other place in unspecified non-institutional (private) residence as the place of occurrence of the external cause: Secondary | ICD-10-CM | POA: Diagnosis not present

## 2017-05-27 DIAGNOSIS — Y998 Other external cause status: Secondary | ICD-10-CM | POA: Insufficient documentation

## 2017-05-27 HISTORY — PX: OPEN REDUCTION INTERNAL FIXATION (ORIF) DISTAL RADIAL FRACTURE: SHX5989

## 2017-05-27 LAB — CBC
HEMATOCRIT: 32.3 % — AB (ref 39.0–52.0)
Hemoglobin: 10 g/dL — ABNORMAL LOW (ref 13.0–17.0)
MCH: 25.4 pg — AB (ref 26.0–34.0)
MCHC: 31 g/dL (ref 30.0–36.0)
MCV: 82.2 fL (ref 78.0–100.0)
PLATELETS: 284 10*3/uL (ref 150–400)
RBC: 3.93 MIL/uL — AB (ref 4.22–5.81)
RDW: 16.1 % — AB (ref 11.5–15.5)
WBC: 4 10*3/uL (ref 4.0–10.5)

## 2017-05-27 SURGERY — OPEN REDUCTION INTERNAL FIXATION (ORIF) DISTAL RADIUS FRACTURE
Anesthesia: Regional | Site: Wrist | Laterality: Left

## 2017-05-27 MED ORDER — OXYCODONE-ACETAMINOPHEN 5-325 MG PO TABS: 1.0000 | ORAL_TABLET | Freq: Three times a day (TID) | ORAL | 0 refills | Status: AC

## 2017-05-27 MED ORDER — CEFAZOLIN SODIUM-DEXTROSE 2-4 GM/100ML-% IV SOLN
2.0000 g | INTRAVENOUS | Status: AC
  Administered 2017-05-27: 2 g via INTRAVENOUS
  Filled 2017-05-27: qty 100

## 2017-05-27 MED ORDER — FENTANYL CITRATE (PF) 250 MCG/5ML IJ SOLN
INTRAMUSCULAR | Status: AC
  Filled 2017-05-27: qty 5

## 2017-05-27 MED ORDER — PHENYLEPHRINE 40 MCG/ML (10ML) SYRINGE FOR IV PUSH (FOR BLOOD PRESSURE SUPPORT)
PREFILLED_SYRINGE | INTRAVENOUS | Status: DC | PRN
  Administered 2017-05-27 (×2): 80 ug via INTRAVENOUS

## 2017-05-27 MED ORDER — PROMETHAZINE HCL 25 MG/ML IJ SOLN: 6.2500 mg | INTRAMUSCULAR | Status: DC | PRN

## 2017-05-27 MED ORDER — OXYCODONE HCL 5 MG/5ML PO SOLN: 5.0000 mg | Freq: Once | ORAL | Status: DC | PRN

## 2017-05-27 MED ORDER — LIDOCAINE HCL (PF) 2 % IJ SOLN
INTRAMUSCULAR | Status: DC | PRN
  Administered 2017-05-27: 15 mL via INTRADERMAL

## 2017-05-27 MED ORDER — MIDAZOLAM HCL 2 MG/2ML IJ SOLN
INTRAMUSCULAR | Status: DC | PRN
  Administered 2017-05-27: 2 mg via INTRAVENOUS

## 2017-05-27 MED ORDER — FENTANYL CITRATE (PF) 250 MCG/5ML IJ SOLN
INTRAMUSCULAR | Status: DC | PRN
  Administered 2017-05-27: 50 ug via INTRAVENOUS

## 2017-05-27 MED ORDER — PROPOFOL 10 MG/ML IV BOLUS
INTRAVENOUS | Status: DC | PRN
  Administered 2017-05-27 (×2): 20 mg via INTRAVENOUS
  Administered 2017-05-27: 100 mg via INTRAVENOUS

## 2017-05-27 MED ORDER — ONDANSETRON HCL 4 MG/2ML IJ SOLN
INTRAMUSCULAR | Status: DC | PRN
  Administered 2017-05-27: 4 mg via INTRAVENOUS

## 2017-05-27 MED ORDER — VITAMIN C 500 MG PO TABS: 500.0000 mg | ORAL_TABLET | Freq: Every day | ORAL | 0 refills | Status: DC

## 2017-05-27 MED ORDER — FENTANYL CITRATE (PF) 100 MCG/2ML IJ SOLN
INTRAMUSCULAR | Status: AC
  Administered 2017-05-27: 100 ug via INTRAVENOUS
  Filled 2017-05-27: qty 2

## 2017-05-27 MED ORDER — DEXAMETHASONE SODIUM PHOSPHATE 10 MG/ML IJ SOLN
INTRAMUSCULAR | Status: DC | PRN
  Administered 2017-05-27: 10 mg via INTRAVENOUS

## 2017-05-27 MED ORDER — METHOCARBAMOL 500 MG PO TABS: 500.0000 mg | ORAL_TABLET | Freq: Three times a day (TID) | ORAL | 0 refills | Status: DC

## 2017-05-27 MED ORDER — DEXAMETHASONE SODIUM PHOSPHATE 10 MG/ML IJ SOLN
INTRAMUSCULAR | Status: AC
  Filled 2017-05-27: qty 1

## 2017-05-27 MED ORDER — MIDAZOLAM HCL 2 MG/2ML IJ SOLN
INTRAMUSCULAR | Status: AC
  Filled 2017-05-27: qty 2

## 2017-05-27 MED ORDER — MIDAZOLAM HCL 2 MG/2ML IJ SOLN
1.0000 mg | Freq: Once | INTRAMUSCULAR | Status: AC
  Administered 2017-05-27: 1 mg via INTRAVENOUS

## 2017-05-27 MED ORDER — EPHEDRINE 5 MG/ML INJ
INTRAVENOUS | Status: AC
  Filled 2017-05-27: qty 10

## 2017-05-27 MED ORDER — MEPERIDINE HCL 25 MG/ML IJ SOLN: 6.2500 mg | INTRAMUSCULAR | Status: DC | PRN

## 2017-05-27 MED ORDER — LACTATED RINGERS IV SOLN
INTRAVENOUS | Status: DC
  Administered 2017-05-27 (×2): via INTRAVENOUS

## 2017-05-27 MED ORDER — PROPOFOL 500 MG/50ML IV EMUL
INTRAVENOUS | Status: DC | PRN
  Administered 2017-05-27: 100 ug/kg/min via INTRAVENOUS

## 2017-05-27 MED ORDER — PROPOFOL 10 MG/ML IV BOLUS
INTRAVENOUS | Status: AC
  Filled 2017-05-27: qty 20

## 2017-05-27 MED ORDER — ROPIVACAINE HCL 7.5 MG/ML IJ SOLN
INTRAMUSCULAR | Status: DC | PRN
  Administered 2017-05-27: 25 mL via PERINEURAL

## 2017-05-27 MED ORDER — HYDROMORPHONE HCL 1 MG/ML IJ SOLN: 0.2500 mg | INTRAMUSCULAR | Status: DC | PRN

## 2017-05-27 MED ORDER — 0.9 % SODIUM CHLORIDE (POUR BTL) OPTIME
TOPICAL | Status: DC | PRN
  Administered 2017-05-27: 1000 mL

## 2017-05-27 MED ORDER — CHLORHEXIDINE GLUCONATE 4 % EX LIQD
60.0000 mL | Freq: Once | CUTANEOUS | Status: DC
  Filled 2017-05-27: qty 60

## 2017-05-27 MED ORDER — PHENYLEPHRINE 40 MCG/ML (10ML) SYRINGE FOR IV PUSH (FOR BLOOD PRESSURE SUPPORT)
PREFILLED_SYRINGE | INTRAVENOUS | Status: AC
  Filled 2017-05-27: qty 10

## 2017-05-27 MED ORDER — OXYCODONE HCL 5 MG PO TABS: 5.0000 mg | ORAL_TABLET | Freq: Once | ORAL | Status: DC | PRN

## 2017-05-27 MED ORDER — MORPHINE SULFATE (PF) 4 MG/ML IV SOLN
INTRAVENOUS | Status: AC
  Filled 2017-05-27: qty 1

## 2017-05-27 MED ORDER — ONDANSETRON HCL 4 MG/2ML IJ SOLN
INTRAMUSCULAR | Status: AC
  Filled 2017-05-27: qty 2

## 2017-05-27 MED ORDER — EPHEDRINE SULFATE-NACL 50-0.9 MG/10ML-% IV SOSY
PREFILLED_SYRINGE | INTRAVENOUS | Status: DC | PRN
  Administered 2017-05-27: 10 mg via INTRAVENOUS

## 2017-05-27 MED ORDER — MIDAZOLAM HCL 2 MG/2ML IJ SOLN
INTRAMUSCULAR | Status: AC
  Administered 2017-05-27: 1 mg via INTRAVENOUS
  Filled 2017-05-27: qty 2

## 2017-05-27 MED ORDER — MORPHINE SULFATE (PF) 4 MG/ML IV SOLN
4.0000 mg | Freq: Once | INTRAVENOUS | Status: AC
  Administered 2017-05-27: 4 mg via INTRAVENOUS

## 2017-05-27 MED ORDER — FENTANYL CITRATE (PF) 100 MCG/2ML IJ SOLN
100.0000 ug | Freq: Once | INTRAMUSCULAR | Status: AC
  Administered 2017-05-27: 100 ug via INTRAVENOUS

## 2017-05-27 MED ORDER — DOCUSATE SODIUM 100 MG PO CAPS: 100.0000 mg | ORAL_CAPSULE | Freq: Two times a day (BID) | ORAL | 0 refills | Status: DC

## 2017-05-27 SURGICAL SUPPLY — 78 items
BANDAGE ACE 3X5.8 VEL STRL LF (GAUZE/BANDAGES/DRESSINGS) ×3 IMPLANT
BANDAGE ACE 4X5 VEL STRL LF (GAUZE/BANDAGES/DRESSINGS) ×3 IMPLANT
BIT DRILL 2.2 SS TIBIAL (BIT) ×2 IMPLANT
BLADE CLIPPER SURG (BLADE) IMPLANT
BNDG ADH 5X3 H2O RPLNT NS (GAUZE/BANDAGES/DRESSINGS) ×1
BNDG ADH 5X4 AIR PERM ELC (GAUZE/BANDAGES/DRESSINGS) ×1
BNDG CMPR 9X4 STRL LF SNTH (GAUZE/BANDAGES/DRESSINGS) ×1
BNDG COHESIVE 3X5 WHT NS (GAUZE/BANDAGES/DRESSINGS) ×2 IMPLANT
BNDG COHESIVE 4X5 WHT NS (GAUZE/BANDAGES/DRESSINGS) ×2 IMPLANT
BNDG ESMARK 4X9 LF (GAUZE/BANDAGES/DRESSINGS) ×3 IMPLANT
BNDG GAUZE ELAST 4 BULKY (GAUZE/BANDAGES/DRESSINGS) ×3 IMPLANT
CANISTER SUCT 3000ML PPV (MISCELLANEOUS) ×3 IMPLANT
CORDS BIPOLAR (ELECTRODE) ×3 IMPLANT
COVER MAYO STAND STRL (DRAPES) ×2 IMPLANT
COVER SURGICAL LIGHT HANDLE (MISCELLANEOUS) ×3 IMPLANT
CUFF TOURNIQUET SINGLE 18IN (TOURNIQUET CUFF) ×3 IMPLANT
CUFF TOURNIQUET SINGLE 24IN (TOURNIQUET CUFF) IMPLANT
DECANTER SPIKE VIAL GLASS SM (MISCELLANEOUS) ×1 IMPLANT
DRAPE OEC MINIVIEW 54X84 (DRAPES) ×5 IMPLANT
DRAPE SURG 17X11 SM STRL (DRAPES) ×3 IMPLANT
DRSG ADAPTIC 3X8 NADH LF (GAUZE/BANDAGES/DRESSINGS) ×3 IMPLANT
GAUZE SPONGE 4X4 12PLY STRL (GAUZE/BANDAGES/DRESSINGS) ×1 IMPLANT
GAUZE SPONGE 4X4 12PLY STRL LF (GAUZE/BANDAGES/DRESSINGS) ×2 IMPLANT
GAUZE SPONGE 4X4 16PLY XRAY LF (GAUZE/BANDAGES/DRESSINGS) ×1 IMPLANT
GAUZE XEROFORM 5X9 LF (GAUZE/BANDAGES/DRESSINGS) ×1 IMPLANT
GLOVE BIOGEL PI IND STRL 8.5 (GLOVE) ×1 IMPLANT
GLOVE BIOGEL PI INDICATOR 8.5 (GLOVE) ×2
GLOVE SURG ORTHO 8.0 STRL STRW (GLOVE) ×5 IMPLANT
GOWN STRL REUS W/ TWL LRG LVL3 (GOWN DISPOSABLE) ×1 IMPLANT
GOWN STRL REUS W/ TWL XL LVL3 (GOWN DISPOSABLE) ×1 IMPLANT
GOWN STRL REUS W/TWL LRG LVL3 (GOWN DISPOSABLE) ×3
GOWN STRL REUS W/TWL XL LVL3 (GOWN DISPOSABLE) ×3
K-WIRE 1.6 (WIRE) ×3
K-WIRE FX5X1.6XNS BN SS (WIRE) ×1
KIT BASIN OR (CUSTOM PROCEDURE TRAY) ×3 IMPLANT
KIT ROOM TURNOVER OR (KITS) ×3 IMPLANT
KWIRE FX5X1.6XNS BN SS (WIRE) IMPLANT
NDL HYPO 25X1 1.5 SAFETY (NEEDLE) ×1 IMPLANT
NEEDLE HYPO 25X1 1.5 SAFETY (NEEDLE) IMPLANT
NS IRRIG 1000ML POUR BTL (IV SOLUTION) ×3 IMPLANT
PACK ORTHO EXTREMITY (CUSTOM PROCEDURE TRAY) ×3 IMPLANT
PAD ARMBOARD 7.5X6 YLW CONV (MISCELLANEOUS) ×4 IMPLANT
PAD CAST 3X4 CTTN HI CHSV (CAST SUPPLIES) IMPLANT
PAD CAST 4YDX4 CTTN HI CHSV (CAST SUPPLIES) ×1 IMPLANT
PADDING CAST COTTON 3X4 STRL (CAST SUPPLIES) ×3
PADDING CAST COTTON 4X4 STRL (CAST SUPPLIES) ×3
PEG LOCKING SMOOTH 2.2X24 (Peg) ×4 IMPLANT
PLATE STD DVR LEFT (Plate) ×3 IMPLANT
PLATE STD DVR LT 24X55 (Plate) IMPLANT
PUTTY DBM STAGRAFT PLUS 5CC (Putty) ×2 IMPLANT
SCREW LOCK 16X2.7X 3 LD TPR (Screw) IMPLANT
SCREW LOCK 18X2.7X 3 LD TPR (Screw) IMPLANT
SCREW LOCK 24X2.7X3 LD THRD (Screw) IMPLANT
SCREW LOCK 26X2.7X 3 LD TPR (Screw) IMPLANT
SCREW LOCKING 2.7X16 (Screw) ×9 IMPLANT
SCREW LOCKING 2.7X18 (Screw) ×6 IMPLANT
SCREW LOCKING 2.7X24MM (Screw) ×9 IMPLANT
SCREW LOCKING 2.7X26MM (Screw) ×6 IMPLANT
SCREW MULTI DIRECTIONAL 2.7X24 (Screw) ×2 IMPLANT
SCREW NONLOCK 2.7X28MM (Screw) ×2 IMPLANT
SLING ARM FOAM STRAP LRG (SOFTGOODS) ×2 IMPLANT
SOAP 2 % CHG 4 OZ (WOUND CARE) ×3 IMPLANT
SPONGE LAP 4X18 X RAY DECT (DISPOSABLE) ×3 IMPLANT
SUCTION FRAZIER TIP 8 FR DISP (SUCTIONS) ×2
SUCTION TUBE FRAZIER 8FR DISP (SUCTIONS) IMPLANT
SUT PROLENE 3 0 PS 1 (SUTURE) ×4 IMPLANT
SUT PROLENE 4 0 PS 2 18 (SUTURE) IMPLANT
SUT VIC AB 2-0 CT1 27 (SUTURE) ×3
SUT VIC AB 2-0 CT1 TAPERPNT 27 (SUTURE) IMPLANT
SUT VIC AB 4-0 PS2 18 (SUTURE) ×2 IMPLANT
SUT VICRYL 4-0 PS2 18IN ABS (SUTURE) IMPLANT
SYR CONTROL 10ML LL (SYRINGE) IMPLANT
TOWEL OR 17X24 6PK STRL BLUE (TOWEL DISPOSABLE) ×3 IMPLANT
TOWEL OR 17X26 10 PK STRL BLUE (TOWEL DISPOSABLE) ×3 IMPLANT
TUBE CONNECTING 12'X1/4 (SUCTIONS) ×1
TUBE CONNECTING 12X1/4 (SUCTIONS) ×2 IMPLANT
WATER STERILE IRR 1000ML POUR (IV SOLUTION) ×1 IMPLANT
YANKAUER SUCT BULB TIP NO VENT (SUCTIONS) IMPLANT

## 2017-05-27 NOTE — Anesthesia Procedure Notes (Signed)
Procedure Name: MAC Date/Time: 05/27/2017 4:31 PM Performed by: Barrington Ellison Pre-anesthesia Checklist: Patient identified, Emergency Drugs available, Suction available, Patient being monitored and Timeout performed Oxygen Delivery Method: Simple face mask

## 2017-05-27 NOTE — Op Note (Signed)
PREOPERATIVE DIAGNOSIS:left wrist intra-articular distal radius fracture 3 or more fragments  POSTOPERATIVE DIAGNOSIS:same  ATTENDING SURGEON:Dr. Iran Planas who was scrubbed and present for the entire procedure  ASSISTANT SURGEON:Samantha Tenny Craw who was scrubbed and necessary for the entire procedure who helped aid and internal fixation management of the complicated fracture closure splinting in a timely fashion  ANESTHESIA:Gen. Via LMA with left upper extremity block  OPERATIVE PROCEDURE:#1:Open treatment of left wrist intra-articular distal radius fracture 3 more fragments #2: Left wrist brachia radialis tendon tenotomy and release #3: Left wrist radiographs 3 views  IMPLANTS:Biomet DVR volar rim cross lock plate standard  RADIOGRAPHIC INTERPRETATION:AP lateral and oblique views of the wrist to show the volar plate fixation place with good restoration of the radial height inclination and tilt  SURGICAL INDICATIONS:Nathan Kane is a right-hand-dominant gentleman who fell off a ladder today sustaining a closed injury to his left wrist. Patient had a highly comminuted intra-articular distal radius fracture and the patient is recommended to undergo the above procedure. Risks benefits and alternatives were discussed in detail the patient in a signed informed consent was obtained. Risks include but not limited to bleeding infection damage to nearby nerves arteries or tendons loss of motion of the wrists and digits incomplete relief of symptoms and need for further surgical intervention.  SURGICAL TECHNIQUE:patient is properly identified in the preoperative holding area marked for permanent marker made a left wrist indicate correct operative site. Patient brought back to operating room placed supine on anesthesia and table general anesthesia was administered. Patient previously undergone the block by anesthesia. Left upper extremity was well-padded with a tourniquet on the left brachium. Left  upper extremity was then wrapped and draped in normal sterile fashion. Timeout was called the correct site was identified and the procedure was then begun.the limb was elevated tourniquet insufflated. A longitudinal incision made directly over the FCR sheath. Dissection carried down through the skin and subcutaneous tissue. The FCR sheath was then opened proximally and distally going through the floor the FCR sheath the FPL was then carefully identified. The pronator quadratus was then identified and elevated in an L-shaped fashion. This exposed the highly comminuted intra-articular distal radius fracture 3 or more fragments. The brachia radialis was then carefully released off the radial styloid protecting the first dorsal compartments. Tendon tenotomy was then carried out of the brachia radialis to  gain exposure to the intra-articular fragments.the patient did have a large degree of comminution several articular fragments were removed from the metaphysis of the distal radius that had been displaced through the articular margins into the metaphysis of the distal radius. Several loose cortical pieces were also removed from the distal radial metaphysis. High degree of metaphyseal comminution. The metaphyseal comminution was then packed with several cc of Biomet stay of stay graft bone graft substitute. After packing of the defect in reduction of the intra-articular fracture 3 or more fragments the volar plate was then applied. It was held distally with a K wire and its position confirmed using the mini C-arm.The oblong screw hole was then placed proximally. Plate height was then adjusted. Distal fixation was then carried out from an ulnar to radial direction with a combination of distal locking pegs and screws and multidirectional screws. Final shaft fixation was then carried out with accommodation of locking and nonlocking screws. The wound was thoroughly irrigated. Final radiographs were obtained. Stress  radiography was then carried out to assess the continuity the distal radioulnar joint in the patient did not have  any gross instability with the ulnar styloid fracture. The pronator quadratus had been closed with 2-0 Vicryl. Subtendinous tissues closed with 4-0 Vicryl. The skin was then closed with simple 3-0 Prolene sutures. Adaptic dressing sterile compressive bandage then applied. The patient is then placed in a well-padded sugar tong splint and extubated taken recovery room in good condition.  POSTOPERATIVE PLAN:Patient be discharged to home. Seen back in the office in 2 weeks for wound check suture removal x-rays long-arm cast for an additional 2 weeks then transition to a short arm cast for 2 weeks radiographs at each visit. Therapy beginning at the 6 week mark.

## 2017-05-27 NOTE — Transfer of Care (Signed)
Immediate Anesthesia Transfer of Care Note  Patient: Nathan Kane  Procedure(s) Performed: Procedure(s): OPEN REDUCTION INTERNAL FIXATION (ORIF) DISTAL RADIAL FRACTURE (Left)  Patient Location: PACU  Anesthesia Type:General and Regional  Level of Consciousness: drowsy  Airway & Oxygen Therapy: Patient Spontanous Breathing and Patient connected to nasal cannula oxygen  Post-op Assessment: Report given to RN  Post vital signs: Reviewed and stable  Last Vitals:  Vitals:   05/27/17 1545 05/27/17 1827  BP:  123/83  Pulse: 77 87  Resp: 13 14  Temp:  (!) 36.4 C  SpO2: 99% (P) 95%    Last Pain:  Vitals:   05/27/17 1415  TempSrc:   PainSc: 6          Complications: No apparent anesthesia complications

## 2017-05-27 NOTE — ED Triage Notes (Signed)
Pt was on a ladder trying to put a piece of plywood up on a house and the ladder moved under him and he fell 8 ft and landed on the grass.  Pt states only injury is his left wrist which has a deformity.  He is resting now with NAD.

## 2017-05-27 NOTE — ED Provider Notes (Signed)
[facility] DEPT Provider Note   CSN: [id number] Arrival date & time: 05/27/17  1200     History   Chief Complaint Chief Complaint  Patient presents with  . Fall    HPI Nathan Kane is a 51 y.o. male.  The history is provided by the patient and medical records.    51 year old male with history of hyperlipidemia, presenting to the ED after a fall. Patient was putting some plywood on the roof when the ladder slipped in the wind causing him to fall to the ground in the grass. He landed with his left arm beneath him and wrist was twisted. There was no head injury or loss of consciousness. Patient was able to get up off the grass and a bili on his own. His wife drove him to the ED. Patient denies any chest pain, shortness of breath, abdominal pain or pain of his other extremities. No neck or back pain. No numbness or weakness of his arms or legs. Patient is right-hand dominant.  Past Medical History:  Diagnosis Date  . Hyperlipemia     Patient Active Problem List   Diagnosis Date Noted  . Bilateral foot pain 01/18/2017  . Plantar warts 01/18/2017  . Elevated blood pressure reading 01/18/2017  . Fall 06/24/2012  . Multiple fractures of ribs of left side 06/24/2012  . Lumbar transverse process fracture ([place]) 06/24/2012    Past Surgical History:  Procedure Laterality Date  . KNEE ARTHROSCOPY     left       Home Medications    Prior to Admission medications   Medication Sig Start Date End Date Taking? Authorizing Provider  doxycycline (VIBRAMYCIN) 100 MG capsule Take 1 capsule (100 mg total) by mouth 2 (two) times daily. Patient not taking: Reported on 03/29/2017 03/25/17   Barnet Glasgow, NP  HYDROcodone-acetaminophen (NORCO/VICODIN) 5-325 MG tablet Take 1 tablet by mouth every 4 (four) hours as needed. Patient not taking: Reported on 03/29/2017 03/25/17   Barnet Glasgow, NP    Family History History reviewed. No pertinent family history.  Social  History Social History  Substance Use Topics  . Smoking status: Never Smoker  . Smokeless tobacco: Never Used  . Alcohol use 2.4 oz/week    4 Cans of beer per week     Comment: 4 to 5 days a week     Allergies   Patient has no known allergies.   Review of Systems Review of Systems  Musculoskeletal: Positive for arthralgias and joint swelling.  All other systems reviewed and are negative.    Physical Exam Updated Vital Signs BP 99/63   Pulse 72   Temp 97.8 F (36.6 C) (Oral)   Resp 18   Ht 5\' 8"  (1.727 m)   Wt 77.1 kg (170 lb)   SpO2 96%   BMI 25.85 kg/m   Physical Exam  Constitutional: He is oriented to person, place, and time. He appears well-developed and well-nourished.  HENT:  Head: Normocephalic and atraumatic.  Mouth/Throat: Oropharynx is clear and moist.  No visible head trauma  Eyes: Pupils are equal, round, and reactive to light. Conjunctivae and EOM are normal.  Neck: Normal range of motion.  Cardiovascular: Normal rate, regular rhythm and normal heart sounds.   Pulmonary/Chest: Effort normal and breath sounds normal. No respiratory distress. He has no wheezes.  Abdominal: Soft. Bowel sounds are normal.  Musculoskeletal:       Left wrist: He exhibits decreased range of motion, tenderness, bony tenderness, swelling and deformity.  Left  wrist with deformity noted; able to move fingers but with some pain; no breaks in skin or open wounds; radial pulse intact Hips stable, non-tender, ambulatory  Neurological: He is alert and oriented to person, place, and time.  Skin: Skin is warm and dry.  Psychiatric: He has a normal mood and affect.  Nursing note and vitals reviewed.    ED Treatments / Results  Labs (all labs ordered are listed, but only abnormal results are displayed) Labs Reviewed - No data to display  EKG  EKG Interpretation None       Radiology Dg Wrist Complete Left  Result Date: 05/27/2017 CLINICAL DATA:  51 year old male status  post fall and trauma to left wrist. EXAM: LEFT WRIST - COMPLETE 3+ VIEW COMPARISON:  None. FINDINGS: There is a markedly comminuted fracture involving the distal left radius and radial carpal articulation. Minimally displaced fracture of the ulnar styloid is also noted. There surrounding soft tissue swelling. Remainder of the carpal bones appear intact. IMPRESSION: 1. Markedly comminuted fracture of the distal left radius and involving the radiocarpal articulation. 2. Minimally displaced ulnar styloid fracture. Electronically Signed   By: Kristopher Oppenheim M.D.   On: 05/27/2017 13:07   Dg Hand Complete Left  Result Date: 05/27/2017 CLINICAL DATA:  51 year old male status post fall with trauma to the left wrist. EXAM: LEFT HAND - COMPLETE 3+ VIEW COMPARISON:  None. FINDINGS: A subtle oblique lucency is noted along the midshaft of the fifth proximal phalanx. No other acute fracture dislocation is noted within the hand. Comminuted fracture of the distal left radius and ulnar styloid process. There is no evidence of arthropathy or other focal bone abnormality. Soft tissues are unremarkable. IMPRESSION: 1. Comminuted fracture of the distal left radius and ulnar styloid process. Please see dedicated wrist dictation. 2. Oblique lucency along the midshaft of the fifth proximal phalanx. This may represent a subtle fracture versus a vascular channel. Correlate with point tenderness. Electronically Signed   By: Kristopher Oppenheim M.D.   On: 05/27/2017 13:05    Procedures Procedures (including critical care time)  Medications Ordered in ED Medications - No data to display   Initial Impression / Assessment and Plan / ED Course  I have reviewed the triage vital signs and the nursing notes.  Pertinent labs & imaging results that were available during my care of the patient were reviewed by me and considered in my medical decision making (see chart for details).  51 year old male here with fall. He was on the roof when  the ladder slipped causing him to fall onto the ground onto left wrist. No head injury or loss of consciousness. Only reported injury is left wrist. There is a deformity noted on exam. Hand remains neurovascularly intact. X-ray obtained revealing comminuted fracture of left distal radius as well as ulnar styloid process. Case has been discussed with on call hand surgery, Dr. Caralyn Guile-- will take to OR today for repair.    Final Clinical Impressions(s) / ED Diagnoses   Final diagnoses:  Closed fracture of distal end of left radius, unspecified fracture morphology, initial encounter  Closed nondisplaced fracture of styloid process of left ulna, initial encounter    New Prescriptions New Prescriptions   No medications on file     Larene Pickett, PA-C 05/27/17 1516    Isla Pence, MD 05/27/17 [phone]

## 2017-05-27 NOTE — Discharge Instructions (Signed)
KEEP BANDAGE CLEAN AND DRY CALL OFFICE FOR F/U APPT [phone] IN 14 DAYS KEEP HAND ELEVATED ABOVE HEART OK TO APPLY ICE TO OPERATIVE AREA CONTACT OFFICE IF ANY WORSENING PAIN OR CONCERNS. 

## 2017-05-27 NOTE — Anesthesia Procedure Notes (Addendum)
Anesthesia Regional Block: Axillary brachial plexus block   Pre-Anesthetic Checklist: ,, timeout performed, Correct Patient, Correct Site, Correct Laterality, Correct Procedure, Correct Position, site marked, Risks and benefits discussed,  Surgical consent,  Pre-op evaluation,  At surgeon's request and post-op pain management  Laterality: Left  Prep: chloraprep       Needles:  Injection technique: Single-shot  Needle Type: Stimulator Needle - 40     Needle Length: 4cm  Needle Gauge: 22     Additional Needles:   Procedures:,,,, ultrasound used (permanent image in chart),,,,  Narrative:  Start time: 05/27/2017 3:43 PM End time: 05/27/2017 3:49 PM Injection made incrementally with aspirations every 5 mL.  Performed by: Personally  Anesthesiologist: Nolon Nations  Additional Notes: BP cuff, EKG monitors applied. Sedation begun. Nerve location verified with U/S. Anesthetic injected incrementally, slowly , and after neg aspirations under direct u/s guidance. Good perineural spread. Tolerated well.

## 2017-05-27 NOTE — Anesthesia Preprocedure Evaluation (Signed)
Anesthesia Evaluation  Patient identified by MRN, date of birth, ID band Patient awake    Reviewed: Allergy & Precautions, NPO status , Patient's Chart, lab work & pertinent test results  Airway Mallampati: II  TM Distance: >3 FB Neck ROM: Full    Dental no notable dental hx.    Pulmonary neg pulmonary ROS,    Pulmonary exam normal breath sounds clear to auscultation       Cardiovascular negative cardio ROS Normal cardiovascular exam Rhythm:Regular Rate:Normal     Neuro/Psych negative neurological ROS  negative psych ROS   GI/Hepatic negative GI ROS, Neg liver ROS,   Endo/Other  negative endocrine ROS  Renal/GU negative Renal ROS     Musculoskeletal negative musculoskeletal ROS (+)   Abdominal   Peds  Hematology negative hematology ROS (+)   Anesthesia Other Findings   Reproductive/Obstetrics negative OB ROS                            Anesthesia Physical Anesthesia Plan  ASA: II  Anesthesia Plan: General   Post-op Pain Management:    Induction: Intravenous  PONV Risk Score and Plan: 3 and Ondansetron, Dexamethasone, Midazolam and Propofol infusion  Airway Management Planned: LMA  Additional Equipment:   Intra-op Plan:   Post-operative Plan: Extubation in OR  Informed Consent: I have reviewed the patients History and Physical, chart, labs and discussed the procedure including the risks, benefits and alternatives for the proposed anesthesia with the patient or authorized representative who has indicated his/her understanding and acceptance.   Dental advisory given  Plan Discussed with: CRNA  Anesthesia Plan Comments:        Anesthesia Quick Evaluation

## 2017-05-27 NOTE — ED Notes (Signed)
Pt taken to xray 

## 2017-05-27 NOTE — Consult Note (Signed)
Reason for Consult:Wrist fx Referring Physician: Aileen Pilot  Toma Arts is an 51 y.o. male.  HPI: Nathan Kane was on a ladder when it slid out from under him. He landed on outstretched hands and had immediate left wrist pain. He came to the ED and x-rays showed left distal radius/ulna fxs and orthopedic surgery was consulted. He is RHD.  Past Medical History:  Diagnosis Date  . Hyperlipemia     Past Surgical History:  Procedure Laterality Date  . KNEE ARTHROSCOPY     left    History reviewed. No pertinent family history.  Social History:  reports that he has never smoked. He has never used smokeless tobacco. He reports that he drinks about 2.4 oz of alcohol per week . He reports that he does not use drugs.  Allergies: No Known Allergies  Medications: I have reviewed the patient's current medications.  No results found for this or any previous visit (from the past 48 hour(s)).  Dg Wrist Complete Left  Result Date: 05/27/2017 CLINICAL DATA:  51 year old male status post fall and trauma to left wrist. EXAM: LEFT WRIST - COMPLETE 3+ VIEW COMPARISON:  None. FINDINGS: There is a markedly comminuted fracture involving the distal left radius and radial carpal articulation. Minimally displaced fracture of the ulnar styloid is also noted. There surrounding soft tissue swelling. Remainder of the carpal bones appear intact. IMPRESSION: 1. Markedly comminuted fracture of the distal left radius and involving the radiocarpal articulation. 2. Minimally displaced ulnar styloid fracture. Electronically Signed   By: Kristopher Oppenheim M.D.   On: 05/27/2017 13:07   Dg Hand Complete Left  Result Date: 05/27/2017 CLINICAL DATA:  51 year old male status post fall with trauma to the left wrist. EXAM: LEFT HAND - COMPLETE 3+ VIEW COMPARISON:  None. FINDINGS: A subtle oblique lucency is noted along the midshaft of the fifth proximal phalanx. No other acute fracture dislocation is noted within the hand.  Comminuted fracture of the distal left radius and ulnar styloid process. There is no evidence of arthropathy or other focal bone abnormality. Soft tissues are unremarkable. IMPRESSION: 1. Comminuted fracture of the distal left radius and ulnar styloid process. Please see dedicated wrist dictation. 2. Oblique lucency along the midshaft of the fifth proximal phalanx. This may represent a subtle fracture versus a vascular channel. Correlate with point tenderness. Electronically Signed   By: Kristopher Oppenheim M.D.   On: 05/27/2017 13:05    Review of Systems  Constitutional: Negative for weight loss.  HENT: Negative for ear discharge, ear pain, hearing loss and tinnitus.   Eyes: Negative for blurred vision, double vision, photophobia and pain.  Respiratory: Negative for cough, sputum production and shortness of breath.   Cardiovascular: Negative for chest pain.  Gastrointestinal: Negative for abdominal pain, nausea and vomiting.  Genitourinary: Negative for dysuria, flank pain, frequency and urgency.  Musculoskeletal: Positive for joint pain (Left wrist). Negative for back pain, falls, myalgias and neck pain.  Neurological: Negative for dizziness, tingling, sensory change, focal weakness, loss of consciousness and headaches.  Endo/Heme/Allergies: Does not bruise/bleed easily.  Psychiatric/Behavioral: Negative for depression, memory loss and substance abuse. The patient is not nervous/anxious.    Blood pressure 99/63, pulse 72, temperature 97.8 F (36.6 C), temperature source Oral, resp. rate 18, height 5\' 8"  (1.727 m), weight 77.1 kg (170 lb), SpO2 100 %. Physical Exam  Constitutional: He appears well-developed and well-nourished. No distress.  HENT:  Head: Normocephalic.  Eyes: Conjunctivae are normal. Right eye exhibits no discharge. Left eye  exhibits no discharge. No scleral icterus.  Cardiovascular: Normal rate and regular rhythm.   Respiratory: Effort normal. No respiratory distress.   Musculoskeletal:  Left shoulder, elbow, wrist, digits- no skin wounds, TTP wrist, swollen, mildly deformed  Sens  Ax/R/M/U intact  Mot   Ax/ R/ PIN/ M/ AIN/ U intact  Rad 2+  Neurological: He is alert.  Skin: Skin is warm and dry. He is not diaphoretic.  Psychiatric: He has a normal mood and affect. His behavior is normal.    Assessment/Plan: Fall from ladder Left distal radius and ulnar styloid fxs -- Will plan for ORIF this afternoon with Dr. Caralyn Guile. NPO until then.  The patient was seen and evaluated today. The patient did sustain a closed highly comminuted intra-articular distal radius fracture. It's my recommendation at the patient undergo operative stabilization of the highly comminuted fracture. Patient seen and evaluated in the preoperative holding area.  WE ARE PLANNING SURGERY FOR YOUR UPPER EXTREMITY. THE RISKS AND BENEFITS OF SURGERY INCLUDE BUT NOT LIMITED TO BLEEDING INFECTION, DAMAGE TO NEARBY NERVES ARTERIES TENDONS, FAILURE OF SURGERY TO ACCOMPLISH ITS INTENDED GOALS, PERSISTENT SYMPTOMS AND NEED FOR FURTHER SURGICAL INTERVENTION. WITH THIS IN MIND WE WILL PROCEED. I HAVE DISCUSSED WITH THE PATIENT THE PRE AND POSTOPERATIVE REGIMEN AND THE DOS AND DON'TS. PT VOICED UNDERSTANDING AND INFORMED CONSENT SIGNED.  R/B/A DISCUSSED WITH PT IN HOSPITAL.  PT VOICED UNDERSTANDING OF PLAN CONSENT SIGNED DAY OF SURGERY PT SEEN AND EXAMINED PRIOR TO OPERATIVE PROCEDURE/DAY OF SURGERY SITE MARKED. QUESTIONS ANSWERED [place], PA-C Orthopedic Surgery [phone] 05/27/2017, 2:09 PM

## 2017-05-27 NOTE — Anesthesia Postprocedure Evaluation (Signed)
Anesthesia Post Note  Patient: Nathan Kane  Procedure(s) Performed: Procedure(s) (LRB): OPEN REDUCTION INTERNAL FIXATION (ORIF) DISTAL RADIAL FRACTURE (Left)     Patient location during evaluation: PACU Anesthesia Type: Regional and General Level of consciousness: awake and alert Pain management: pain level controlled Vital Signs Assessment: post-procedure vital signs reviewed and stable Respiratory status: spontaneous breathing, nonlabored ventilation, respiratory function stable and patient connected to nasal cannula oxygen Cardiovascular status: blood pressure returned to baseline and stable Postop Assessment: no apparent nausea or vomiting Anesthetic complications: no    Last Vitals:  Vitals:   05/27/17 1830 05/27/17 1840  BP:    Pulse: 85 88  Resp: 12 18  Temp:    SpO2: 96% 100%    Last Pain:  Vitals:   05/27/17 1840  TempSrc:   PainSc: 0-No pain                 Lyda Colcord

## 2017-05-30 ENCOUNTER — Encounter (HOSPITAL_COMMUNITY): Payer: Self-pay | Admitting: Orthopedic Surgery

## 2018-06-01 IMAGING — CR DG WRIST COMPLETE 3+V*L*
4 series · 4 of 4 positions shown · non-contrast
Comparison: None.

CLINICAL DATA: 51-year-old female status post fall and trauma to
left wrist.

EXAM:
LEFT WRIST - COMPLETE 3+ VIEW

[wrist pa]
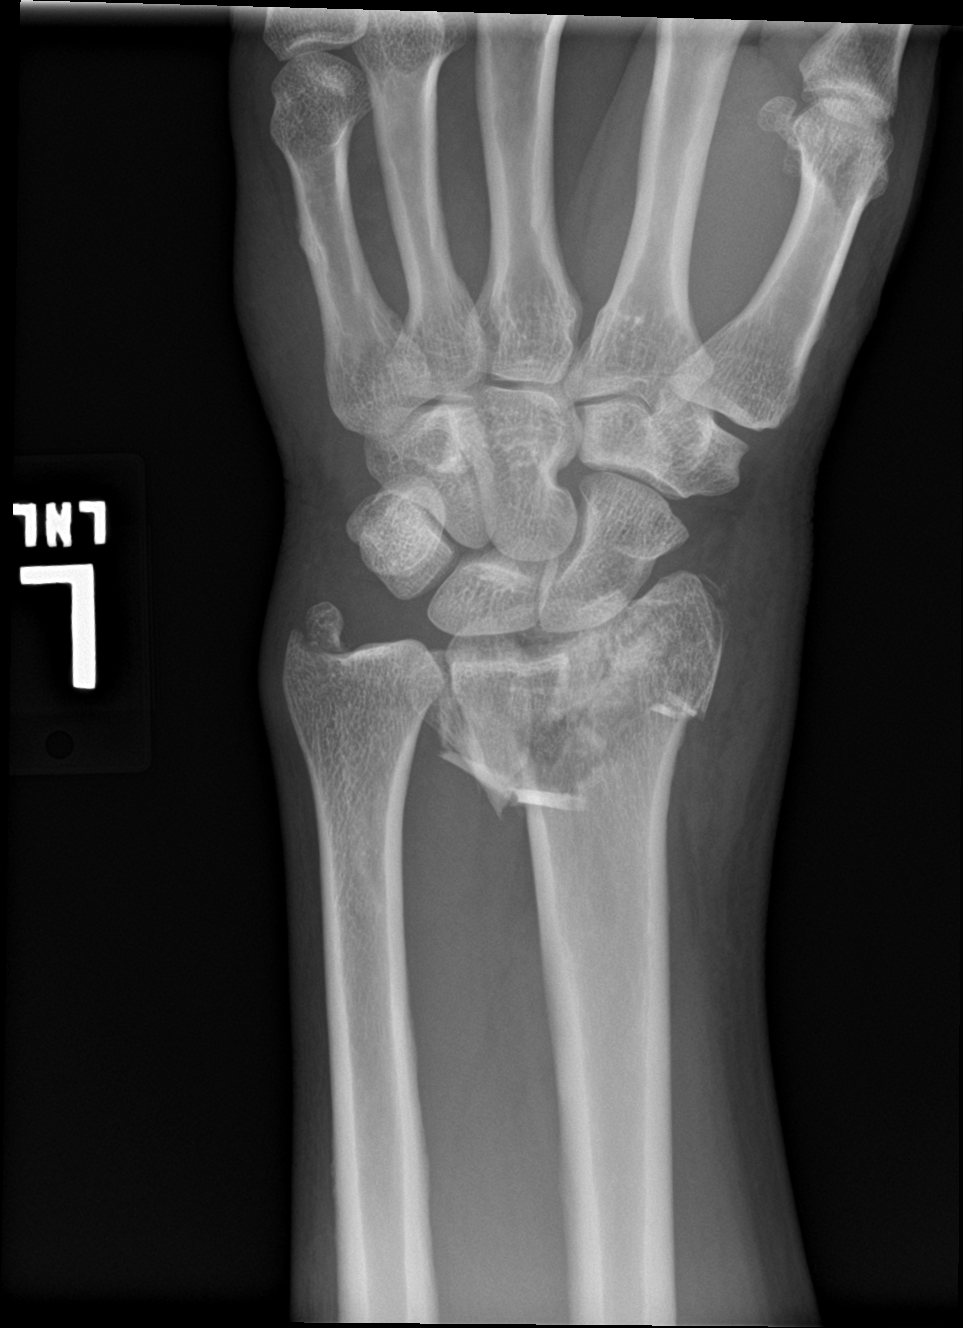

[wrist obl]
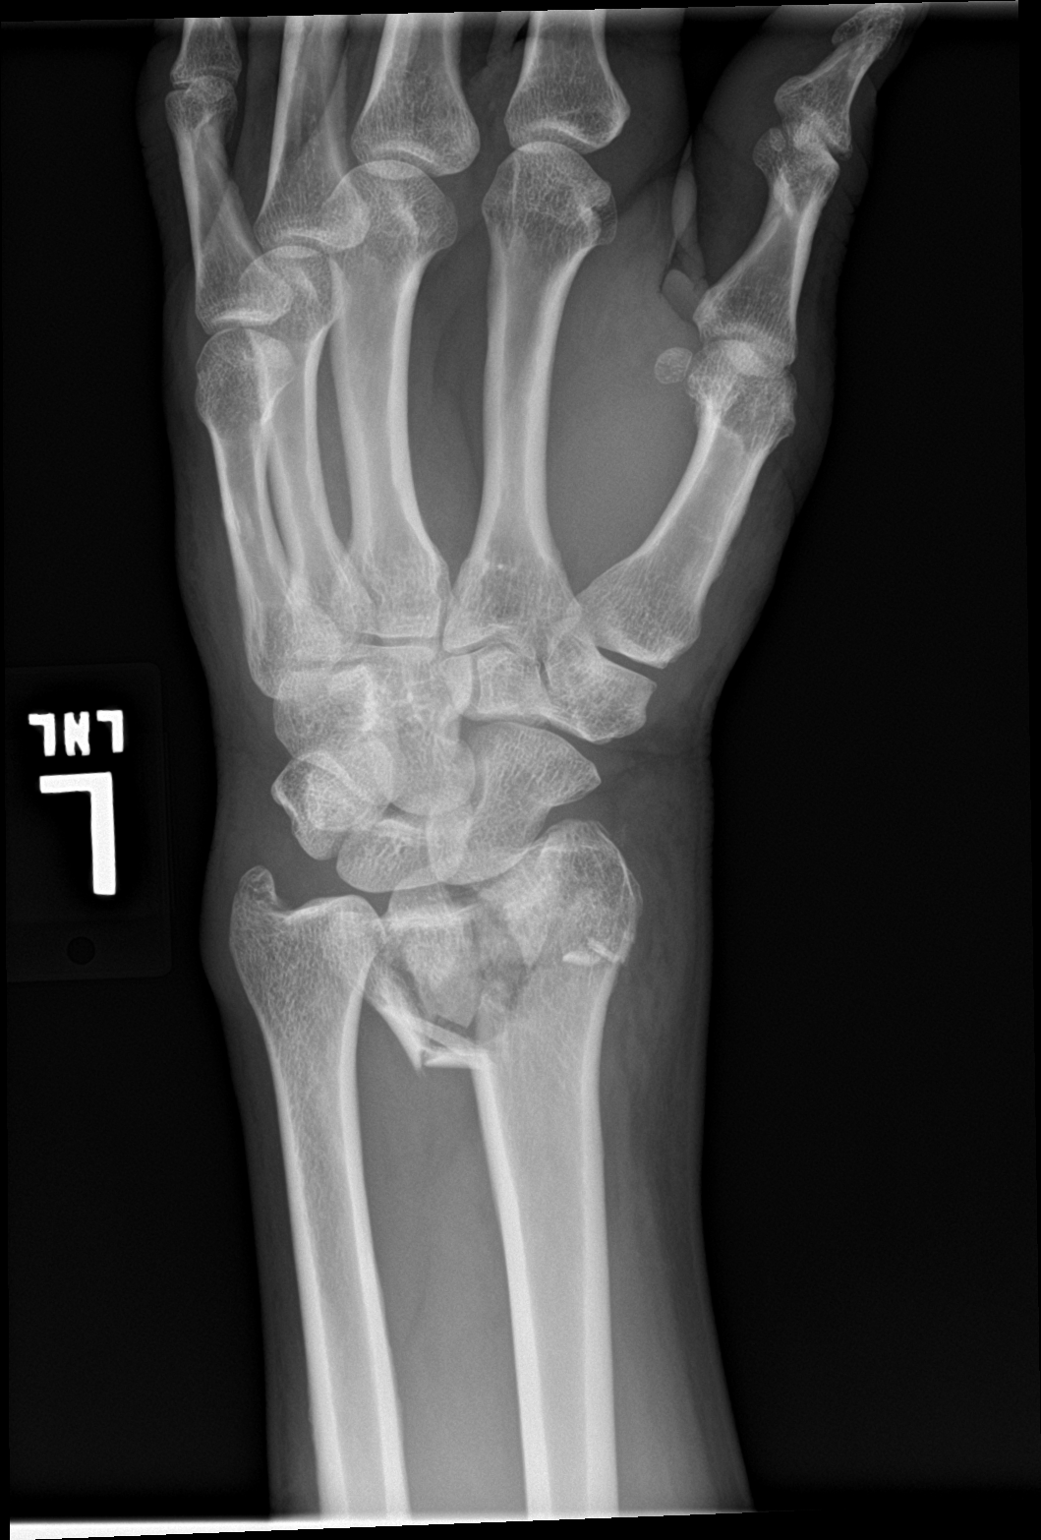

[wrist lat]
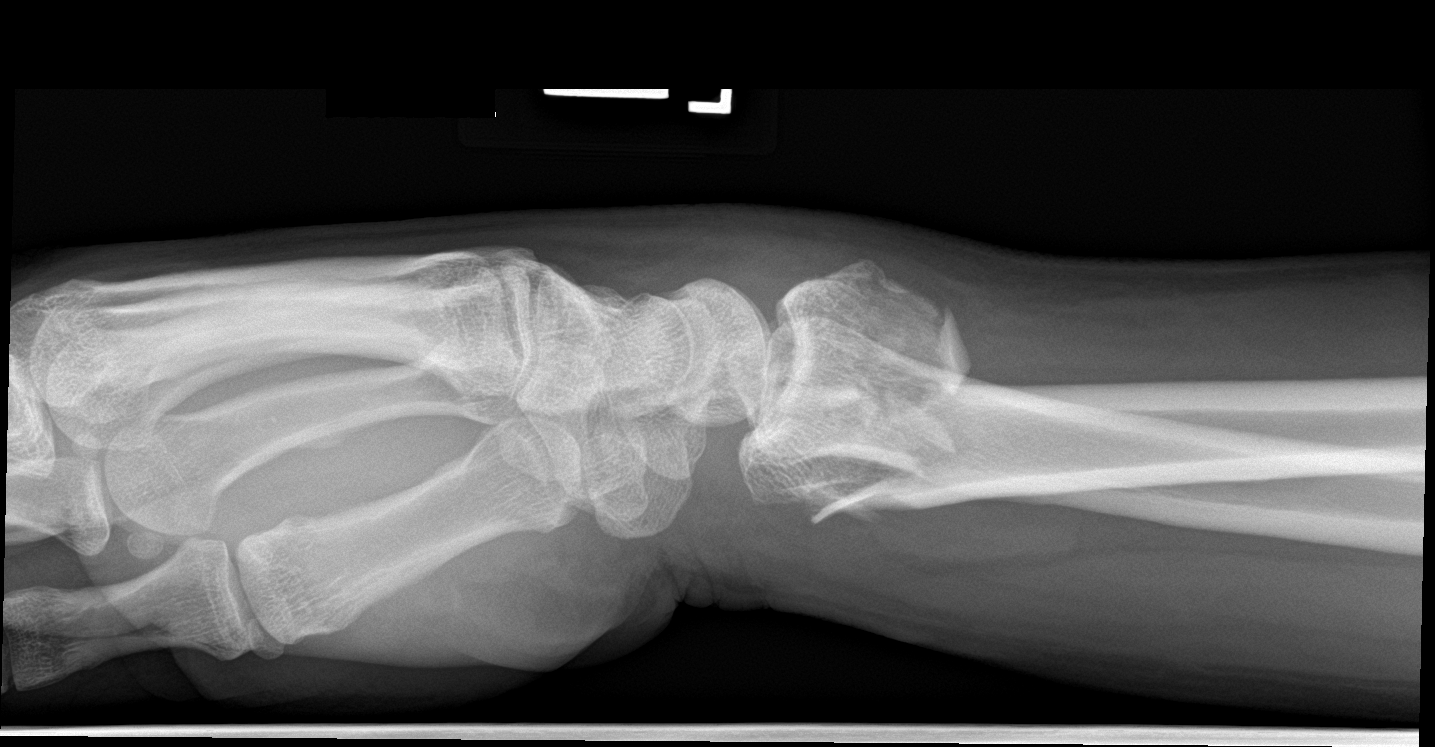

[wrist navicular]
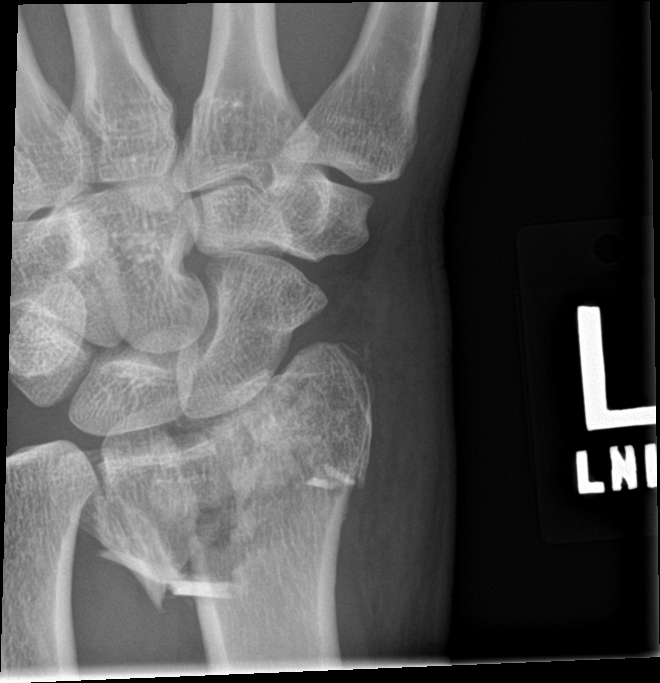

[4 of 4 positions shown; findings below may reference images not displayed]

FINDINGS: There is a markedly comminuted fracture involving the distal left
radius and radial carpal articulation. Minimally displaced fracture
of the ulnar styloid is also noted. There surrounding soft tissue
swelling.

Remainder of the carpal bones appear intact.
IMPRESSION: 1. Markedly comminuted fracture of the distal left radius and
involving the radiocarpal articulation.
2. Minimally displaced ulnar styloid fracture.

## 2018-11-05 ENCOUNTER — Emergency Department (HOSPITAL_COMMUNITY)
Admission: EM | Admit: 2018-11-05 | Discharge: 2018-11-05 | Disposition: A | Discharge status: Home / Self Care | Payer: Self-pay | Attending: Emergency Medicine | Admitting: Emergency Medicine

## 2018-11-05 ENCOUNTER — Encounter (HOSPITAL_COMMUNITY): Payer: Self-pay

## 2018-11-05 ENCOUNTER — Emergency Department (HOSPITAL_COMMUNITY): Payer: Self-pay

## 2018-11-05 DIAGNOSIS — M7042 Prepatellar bursitis, left knee: Secondary | ICD-10-CM | POA: Insufficient documentation

## 2018-11-05 DIAGNOSIS — Z79899 Other long term (current) drug therapy: Secondary | ICD-10-CM | POA: Insufficient documentation

## 2018-11-05 DIAGNOSIS — Y939 Activity, unspecified: Secondary | ICD-10-CM | POA: Insufficient documentation

## 2018-11-05 MED ORDER — IBUPROFEN 600 MG PO TABS: 600.0000 mg | ORAL_TABLET | Freq: Four times a day (QID) | ORAL | 0 refills | Status: DC | PRN

## 2018-11-05 MED ORDER — OXYCODONE-ACETAMINOPHEN 5-325 MG PO TABS
1.0000 | ORAL_TABLET | Freq: Once | ORAL | Status: AC
  Administered 2018-11-05: 1 via ORAL
  Filled 2018-11-05: qty 1

## 2018-11-05 MED ORDER — TRAMADOL HCL 50 MG PO TABS: 50.0000 mg | ORAL_TABLET | Freq: Four times a day (QID) | ORAL | 0 refills | Status: DC | PRN

## 2018-11-05 NOTE — ED Notes (Signed)
Ace wrap applied at left knee. Positive DP pulse after application.

## 2018-11-05 NOTE — ED Provider Notes (Signed)
[facility] DEPART[facility] Provider Note   CSN: [id number] Arrival date & time: 11/05/18  0540    History   Chief Complaint Chief Complaint  Patient presents with  . Knee Pain    HPI Nathan Kane is a 53 y.o. male.     The history is provided by the patient. No language interpreter was used.  Knee Pain  Associated symptoms: no fever      53 year old male presenting for evaluation of left knee pain.  Patient report for the past 2 days he has had pain primarily to his left knee.  The pain is sharp, throbbing, intense, worsening with any movement and now is having difficulty bearing any weight on his knee.  Pain is nonradiating.  This morning he felt some chills.  He denies any associated fever, hip pain, ankle pain, numbness, weakness or rash.  He denies any recent injury.  He did report prior injury to the same knee 8 years ago requiring surgical fixation with pins.  He is up-to-date with tetanus.  Currently rates his pain is 9 out of 10.  Past Medical History:  Diagnosis Date  . Hyperlipemia     Patient Active Problem List   Diagnosis Date Noted  . Bilateral foot pain 01/18/2017  . Plantar warts 01/18/2017  . Elevated blood pressure reading 01/18/2017  . Fall 06/24/2012  . Multiple fractures of ribs of left side 06/24/2012  . Lumbar transverse process fracture ([place]) 06/24/2012    Past Surgical History:  Procedure Laterality Date  . KNEE ARTHROSCOPY     left  . OPEN REDUCTION INTERNAL FIXATION (ORIF) DISTAL RADIAL FRACTURE Left 05/27/2017   Procedure: OPEN REDUCTION INTERNAL FIXATION (ORIF) DISTAL RADIAL FRACTURE;  Surgeon: Iran Planas, MD;  Location: [facility];  Service: Orthopedics;  Laterality: Left;        Home Medications    Prior to Admission medications   Medication Sig Start Date End Date Taking? Authorizing Provider  docusate sodium (COLACE) 100 MG capsule Take 1 capsule (100 mg total) by mouth 2 (two) times daily. 05/27/17    Iran Planas, MD  methocarbamol (ROBAXIN) 500 MG tablet Take 1 tablet (500 mg total) by mouth 3 (three) times daily. 05/27/17   Iran Planas, MD  vitamin C (ASCORBIC ACID) 500 MG tablet Take 1 tablet (500 mg total) by mouth daily. 05/27/17   Iran Planas, MD    Family History No family history on file.  Social History Social History   Tobacco Use  . Smoking status: Never Smoker  . Smokeless tobacco: Never Used  Substance Use Topics  . Alcohol use: Yes    Alcohol/week: 4.0 standard drinks    Types: 4 Cans of beer per week    Comment: 4 to 5 days a week  . Drug use: No     Allergies   Patient has no known allergies.   Review of Systems Review of Systems  Constitutional: Positive for chills. Negative for fever.  Musculoskeletal: Positive for joint swelling.  Skin: Negative for rash and wound.     Physical Exam Updated Vital Signs BP 129/83   Pulse (!) 103   Temp 98.5 F (36.9 C) (Oral)   Resp 20   SpO2 99%   Physical Exam Vitals signs and nursing note reviewed.  Constitutional:      General: He is not in acute distress.    Appearance: He is well-developed.  HENT:     Head: Atraumatic.  Eyes:  Conjunctiva/sclera: Conjunctivae normal.  Neck:     Musculoskeletal: Neck supple.  Musculoskeletal:        General: Swelling (Left knee: The knee is moderately edematous as compared to right, with diffuse tenderness more significant to medial joint line on palpation.  Mild warmth without any surrounding skin erythema.  Increasing pain with knee flexion.) present.  Skin:    Findings: No rash.  Neurological:     Mental Status: He is alert.      ED Treatments / Results  Labs (all labs ordered are listed, but only abnormal results are displayed) Labs Reviewed - No data to display  EKG None  Radiology Dg Knee Complete 4 Views Left  Result Date: 11/05/2018 CLINICAL DATA:  53 year old male with left knee pain and swelling. EXAM: LEFT KNEE - COMPLETE 4+ VIEW  COMPARISON:  Left knee radiograph dated 10/02/2009 FINDINGS: There is no acute fracture or dislocation. Two fixation screws noted through the intercondylar eminence of the tibia. No evidence of loosening or complication. There is heterotopic ossification medial to the medial femoral condyle. There is a moderate suprapatellar effusion. The soft tissues appear unremarkable. IMPRESSION: 1. No acute fracture or dislocation. 2. Two tibial pins without complication. 3. Moderate suprapatellar effusion. Electronically Signed   By: Anner Crete M.D.   On: 11/05/2018 06:37    Procedures Procedures (including critical care time)  Medications Ordered in ED Medications  oxyCODONE-acetaminophen (PERCOCET/ROXICET) 5-325 MG per tablet 1 tablet (1 tablet Oral Given 11/05/18 0745)     Initial Impression / Assessment and Plan / ED Course  I have reviewed the triage vital signs and the nursing notes.  Pertinent labs & imaging results that were available during my care of the patient were reviewed by me and considered in my medical decision making (see chart for details).        BP 127/85   Pulse 100   Temp 98.5 F (36.9 C) (Oral)   Resp 15   SpO2 97%    Final Clinical Impressions(s) / ED Diagnoses   Final diagnoses:  Prepatellar bursitis of left knee    ED Discharge Orders         Ordered    ibuprofen (ADVIL,MOTRIN) 600 MG tablet  Every 6 hours PRN     11/05/18 0759    traMADol (ULTRAM) 50 MG tablet  Every 6 hours PRN     11/05/18 0759         7:04 AM Patient with left knee pain and knee effusion.  Prior injury in the knee 8 years ago requiring surgery with pins placement.  Current Pain is atraumatic.  Obvious swelling noted to the prepatellar region with x-ray confirmed effusion of the prepatellar.  Patient is having difficulty bending his knee likely secondary to pain.  Minimal warmth or erythema noted.  Less likely to be septic joint.  I discussed with Dr. Leonides Schanz, who evaluated and felt  knee aspiration not indicated at this time as the swelling is above joint space.  RICE therapy discussed, ortho referral given.    Domenic Moras, PA-C 11/05/18 0802    Ward, Delice Bison, DO 11/05/18 [phone]

## 2018-11-05 NOTE — ED Notes (Signed)
Dc instructions discussed and pt stats he understands and performs teach back. Pt is aware he cannot drive after medication and is calling for ride .

## 2018-11-05 NOTE — ED Triage Notes (Signed)
Pt states that he has been having L knee pain for the past two days, pain shoots down to his foot, denies injury

## 2018-11-14 ENCOUNTER — Ambulatory Visit (INDEPENDENT_AMBULATORY_CARE_PROVIDER_SITE_OTHER): Payer: Self-pay | Admitting: Orthopaedic Surgery

## 2018-11-14 ENCOUNTER — Encounter (INDEPENDENT_AMBULATORY_CARE_PROVIDER_SITE_OTHER): Payer: Self-pay | Admitting: Orthopaedic Surgery

## 2018-11-14 DIAGNOSIS — M25562 Pain in left knee: Secondary | ICD-10-CM

## 2018-11-14 DIAGNOSIS — G8929 Other chronic pain: Secondary | ICD-10-CM

## 2018-11-14 MED ORDER — METHYLPREDNISOLONE ACETATE 40 MG/ML IJ SUSP
40.0000 mg | INTRAMUSCULAR | Status: AC | PRN
  Administered 2018-11-14: 40 mg via INTRA_ARTICULAR

## 2018-11-14 MED ORDER — LIDOCAINE HCL 1 % IJ SOLN
2.0000 mL | INTRAMUSCULAR | Status: AC | PRN
  Administered 2018-11-14: 2 mL

## 2018-11-14 MED ORDER — BUPIVACAINE HCL 0.5 % IJ SOLN
2.0000 mL | INTRAMUSCULAR | Status: AC | PRN
  Administered 2018-11-14: 2 mL via INTRA_ARTICULAR

## 2018-11-14 NOTE — Progress Notes (Signed)
Office Visit Note   Patient: Nathan Kane           Date of Birth: [birth year]           MRN: [id number] Visit Date: 11/14/2018              Requested by: No referring provider defined for this encounter. PCP: Patient, No Pcp Per   Assessment & Plan: Visit Diagnoses:  1. Chronic pain of left knee     Plan: Impression is left knee osteoarthritis exacerbation with small joint effusion.  15 cc of joint fluid was aspirated from the knee which was sent for analysis.  He is to limit his activities as needed for the next couple weeks.  We will see him back as needed.  Follow-Up Instructions: Return if symptoms worsen or fail to improve.   Orders:  No orders of the defined types were placed in this encounter.  No orders of the defined types were placed in this encounter.     Procedures: Large Joint Inj: L knee on 11/14/2018 10:21 AM Details: 22 G needle Medications: 2 mL bupivacaine 0.5 %; 2 mL lidocaine 1 %; 40 mg methylPREDNISolone acetate 40 MG/ML Aspirate: 15 mL blood-tinged; sent for lab analysis Outcome: tolerated well, no immediate complications Patient was prepped and draped in the usual sterile fashion.       Clinical Data: No additional findings.   Subjective: Chief Complaint  Patient presents with  . Left Knee - Pain    Mr. Nathan Kane is a 53 year old gentleman who comes in with left knee pain for couple weeks.  He reports that he was doing a lot of walking and working recently and his knee swelled up and he was having trouble walking.  He had prior surgery on this left knee about 8 to 9 years ago for MCL tear and tibial eminence avulsion fracture.  He had no problems with the surgery postoperatively.  He denies any numbness and tingling or constitutional symptoms.   Review of Systems  Constitutional: Negative.   All other systems reviewed and are negative.    Objective: Vital Signs: There were no vitals taken for this visit.  Physical Exam Vitals  signs and nursing note reviewed.  Constitutional:      Appearance: He is well-developed.  Pulmonary:     Effort: Pulmonary effort is normal.  Abdominal:     Palpations: Abdomen is soft.  Skin:    General: Skin is warm.  Neurological:     Mental Status: He is alert and oriented to person, place, and time.  Psychiatric:        Behavior: Behavior normal.        Thought Content: Thought content normal.        Judgment: Judgment normal.     Ortho Exam Left knee exam shows a small joint effusion.  Collaterals and cruciates are stable.  No evidence of infection.  Normal range of motion. Specialty Comments:  No specialty comments available.  Imaging: No results found.   PMFS History: Patient Active Problem List   Diagnosis Date Noted  . Bilateral foot pain 01/18/2017  . Plantar warts 01/18/2017  . Elevated blood pressure reading 01/18/2017  . Fall 06/24/2012  . Multiple fractures of ribs of left side 06/24/2012  . Lumbar transverse process fracture ([place]) 06/24/2012   Past Medical History:  Diagnosis Date  . Hyperlipemia     No family history on file.  Past Surgical History:  Procedure Laterality Date  .  KNEE ARTHROSCOPY     left  . OPEN REDUCTION INTERNAL FIXATION (ORIF) DISTAL RADIAL FRACTURE Left 05/27/2017   Procedure: OPEN REDUCTION INTERNAL FIXATION (ORIF) DISTAL RADIAL FRACTURE;  Surgeon: Iran Planas, MD;  Location: [facility];  Service: Orthopedics;  Laterality: Left;   Social History   Occupational History  . Not on file  Tobacco Use  . Smoking status: Never Smoker  . Smokeless tobacco: Never Used  Substance and Sexual Activity  . Alcohol use: Yes    Alcohol/week: 4.0 standard drinks    Types: 4 Cans of beer per week    Comment: 4 to 5 days a week  . Drug use: No  . Sexual activity: Not on file

## 2018-11-14 NOTE — Addendum Note (Signed)
Addended by: Precious Bard on: 11/14/2018 04:09 PM   Modules accepted: Orders

## 2018-11-15 LAB — SYNOVIAL CELL COUNT + DIFF, W/ CRYSTALS
Basophils, %: 0 %
EOSINOPHILS-SYNOVIAL: 0 % (ref 0–2)
Lymphocytes-Synovial Fld: 28 % (ref 0–74)
MONOCYTE/MACROPHAGE: 7 % (ref 0–69)
Neutrophil, Synovial: 65 % — ABNORMAL HIGH (ref 0–24)
Synoviocytes, %: 0 % (ref 0–15)
WBC, Synovial: 805 cells/uL — ABNORMAL HIGH (ref <150)

## 2018-11-15 NOTE — Progress Notes (Signed)
Pseudogout flare up

## 2018-11-27 ENCOUNTER — Telehealth (INDEPENDENT_AMBULATORY_CARE_PROVIDER_SITE_OTHER): Payer: Self-pay

## 2018-11-27 NOTE — Telephone Encounter (Signed)
Called patient no answer LMOM to return our call.   Leandrew Koyanagi, MD  Precious Bard, RMA        Pseudogout flare up

## 2019-12-13 ENCOUNTER — Ambulatory Visit (INDEPENDENT_AMBULATORY_CARE_PROVIDER_SITE_OTHER): Payer: Self-pay | Admitting: Physician Assistant

## 2019-12-13 ENCOUNTER — Encounter: Payer: Self-pay | Admitting: Physician Assistant

## 2019-12-13 VITALS — BP 98/64 | HR 76 | Temp 97.5°F | Ht 67.0 in | Wt 183.1 lb

## 2019-12-13 DIAGNOSIS — Z01818 Encounter for other preprocedural examination: Secondary | ICD-10-CM

## 2019-12-13 DIAGNOSIS — K625 Hemorrhage of anus and rectum: Secondary | ICD-10-CM

## 2019-12-13 DIAGNOSIS — D509 Iron deficiency anemia, unspecified: Secondary | ICD-10-CM

## 2019-12-13 MED ORDER — NA SULFATE-K SULFATE-MG SULF 17.5-3.13-1.6 GM/177ML PO SOLN: 1.0000 | Freq: Once | ORAL | 0 refills | Status: AC

## 2019-12-13 NOTE — Addendum Note (Signed)
Addended by: Horris Latino on: 12/13/2019 11:28 AM   Modules accepted: Orders

## 2019-12-13 NOTE — Progress Notes (Signed)
Attending Physician's Attestation   I have reviewed the chart.   I agree with the Advanced Practitioner's note, impression, and recommendations with any updates as below.    Thaer Miyoshi Mansouraty, MD [facility] Office # [phone]  

## 2019-12-13 NOTE — Patient Instructions (Signed)
If you are age 52 or older, your body mass index should be between 23-30. Your Body mass index is 28.68 kg/m. If this is out of the aforementioned range listed, please consider follow up with your Primary Care Provider.  If you are age 80 or younger, your body mass index should be between 19-25. Your Body mass index is 28.68 kg/m. If this is out of the aformentioned range listed, please consider follow up with your Primary Care Provider.   You have been scheduled for an endoscopy and colonoscopy. Please follow the written instructions given to you at your visit today. Please pick up your prep supplies at the pharmacy within the next 1-3 days. If you use inhalers (even only as needed), please bring them with you on the day of your procedure.

## 2019-12-13 NOTE — Progress Notes (Addendum)
Chief Complaint: IDA, blood in stools  HPI:    Nathan Kane is a 54 year old male with a past medical history as listed below, who was referred to me by Karel Jarvis, NP for a complaint of iron deficiency anemia and blood in stools.      11/26/2019 CBC with a hemoglobin of 10.7 (12.1 on 10/11/2018).  Patient was started on B12 injections as well as 325 mg of iron daily by his PCP.    Today, the patient presents to clinic and tells me that he has been feeling fairly well but does report that he occasionally sees some bright red blood in his stools, last time was about a month ago which occurred daily for about 2 weeks with a bowel movement.  He feels like this happens whenever he eats something spicy.  Tells me this is just his hemorrhoids.  This has been happening for years off-and-on.  Denies any other GI complaints.    History of a colonoscopy at [facility] 6 years ago.       Denies fever, chills, weight loss, palpitations, shortness of breath, fatigue, dizziness, abdominal pain, heartburn or reflux.  Past Medical History:  Diagnosis Date  . Hyperlipemia     Past Surgical History:  Procedure Laterality Date  . KNEE ARTHROSCOPY     left  . OPEN REDUCTION INTERNAL FIXATION (ORIF) DISTAL RADIAL FRACTURE Left 05/27/2017   Procedure: OPEN REDUCTION INTERNAL FIXATION (ORIF) DISTAL RADIAL FRACTURE;  Surgeon: Iran Planas, MD;  Location: [place];  Service: Orthopedics;  Laterality: Left;    Current Outpatient Medications  Medication Sig Dispense Refill  . vitamin C (ASCORBIC ACID) 500 MG tablet Take 1 tablet (500 mg total) by mouth daily. 50 tablet 0   No current facility-administered medications for this visit.    Allergies as of 12/13/2019  . (No Known Allergies)    Family History  Problem Relation Age of Onset  . Colon cancer Neg Hx   . Esophageal cancer Neg Hx   . Pancreatic cancer Neg Hx   . Stomach cancer Neg Hx     Social History   Socioeconomic History  . Marital  status: Married    Spouse name: Not on file  . Number of children: Not on file  . Years of education: Not on file  . Highest education level: Not on file  Occupational History  . Not on file  Tobacco Use  . Smoking status: Never Smoker  . Smokeless tobacco: Never Used  Substance and Sexual Activity  . Alcohol use: Yes    Alcohol/week: 4.0 standard drinks    Types: 4 Cans of beer per week    Comment: 4 to 5 days a week  . Drug use: No  . Sexual activity: Not on file  Other Topics Concern  . Not on file  Social History Narrative  . Not on file   Social Determinants of Health   Financial Resource Strain:   . Difficulty of Paying Living Expenses:   Food Insecurity:   . Worried About Charity fundraiser in the Last Year:   . Arboriculturist in the Last Year:   Transportation Needs:   . Film/video editor (Medical):   Marland Kitchen Lack of Transportation (Non-Medical):   Physical Activity:   . Days of Exercise per Week:   . Minutes of Exercise per Session:   Stress:   . Feeling of Stress :   Social Connections:   . Frequency of  Communication with Friends and Family:   . Frequency of Social Gatherings with Friends and Family:   . Attends Religious Services:   . Active Member of Clubs or Organizations:   . Attends Archivist Meetings:   Marland Kitchen Marital Status:   Intimate Partner Violence:   . Fear of Current or Ex-Partner:   . Emotionally Abused:   Marland Kitchen Physically Abused:   . Sexually Abused:     Review of Systems:    Constitutional: No weight loss, fever or chills Skin: No rash  Cardiovascular: No chest pain, chest pressure or palpitations   Respiratory: No SOB Gastrointestinal: See HPI and otherwise negative Genitourinary: No dysuria  Neurological: No headache, dizziness or syncope Musculoskeletal: No new muscle or joint pain Hematologic: No bruising Psychiatric: No history of depression or anxiety   Physical Exam:  Vital signs: BP 98/64 (BP Location: Left Arm,  Patient Position: Sitting, Cuff Size: Normal)   Pulse 76   Temp (!) 97.5 F (36.4 C)   Ht 5\' 7"  (1.702 m)   Wt 183 lb 2 oz (83.1 kg)   SpO2 98%   BMI 28.68 kg/m   Constitutional:   Pleasant Hispanic male appears to be in NAD, Well developed, Well nourished, alert and cooperative Head:  Normocephalic and atraumatic. Eyes:   PEERL, EOMI. No icterus. Conjunctiva pink. Ears:  Normal auditory acuity. Neck:  Supple Throat: Oral cavity and pharynx without inflammation, swelling or lesion.  Respiratory: Respirations even and unlabored. Lungs clear to auscultation bilaterally.   No wheezes, crackles, or rhonchi.  Cardiovascular: Normal S1, S2. No MRG. Regular rate and rhythm. No peripheral edema, cyanosis or pallor.  Gastrointestinal:  Soft, nondistended, nontender. No rebound or guarding. Normal bowel sounds. No appreciable masses or hepatomegaly. Rectal:  Not performed.  Msk:  Symmetrical without gross deformities. Without edema, no deformity or joint abnormality.  Neurologic:  Alert and  oriented x4;  grossly normal neurologically.  Skin:   Dry and intact without significant lesions or rashes. Psychiatric: Demonstrates good judgement and reason without abnormal affect or behaviors.  See HPI for recent labs.  Assessment: 1.  IDA: Recent hemoglobin down to 10.7 (previously 12.12 months ago); consider GI source versus other 2.  Rectal bleeding: Occurs off and on, the last time a month ago for 2 weeks; consider hemorrhoids versus polyp versus other  Plan: 1.  Continue iron supplementation and B12 injections as per PCP. 2.  Scheduled the patient for an EGD and colonoscopy with Dr. Rush Landmark in the [facility].  Did discuss risks, benefits, limitations and alternatives and the patient agrees to proceed.  Patient will be Covid tested 2 days prior to time of procedures. 3.  Patient to follow in clinic per recommendations from Dr. Rush Landmark after time of procedures.  Ellouise Newer, PA-C [facility] 12/13/2019, 9:37 AM  Cc: Karel Jarvis, NP   Addendum: 12/20/2019 1043  05/21/2013 colonoscopy with Dr. Penelope Coop was normal with only internal hemorrhoids.  Repeat recommended in 10 years.  No change to plans.  Ellouise Newer, PA-C

## 2020-01-16 ENCOUNTER — Other Ambulatory Visit: Payer: Self-pay | Admitting: Gastroenterology

## 2020-01-16 LAB — SARS CORONAVIRUS 2 (TAT 6-24 HRS): SARS Coronavirus 2: NEGATIVE

## 2020-01-23 ENCOUNTER — Ambulatory Visit (INDEPENDENT_AMBULATORY_CARE_PROVIDER_SITE_OTHER): Payer: Self-pay

## 2020-01-23 DIAGNOSIS — Z1159 Encounter for screening for other viral diseases: Secondary | ICD-10-CM

## 2020-01-24 ENCOUNTER — Other Ambulatory Visit: Payer: Self-pay | Admitting: Gastroenterology

## 2020-01-25 ENCOUNTER — Encounter: Payer: Self-pay | Admitting: Gastroenterology

## 2020-01-25 ENCOUNTER — Ambulatory Visit (AMBULATORY_SURGERY_CENTER): Payer: Self-pay | Admitting: Gastroenterology

## 2020-01-25 ENCOUNTER — Other Ambulatory Visit: Payer: Self-pay

## 2020-01-25 VITALS — BP 104/63 | HR 64 | Temp 95.9°F | Resp 9 | Ht 67.0 in | Wt 183.0 lb

## 2020-01-25 DIAGNOSIS — K625 Hemorrhage of anus and rectum: Secondary | ICD-10-CM

## 2020-01-25 DIAGNOSIS — K2970 Gastritis, unspecified, without bleeding: Secondary | ICD-10-CM

## 2020-01-25 DIAGNOSIS — K2950 Unspecified chronic gastritis without bleeding: Secondary | ICD-10-CM

## 2020-01-25 DIAGNOSIS — K2980 Duodenitis without bleeding: Secondary | ICD-10-CM

## 2020-01-25 DIAGNOSIS — K5730 Diverticulosis of large intestine without perforation or abscess without bleeding: Secondary | ICD-10-CM

## 2020-01-25 DIAGNOSIS — K641 Second degree hemorrhoids: Secondary | ICD-10-CM

## 2020-01-25 DIAGNOSIS — D509 Iron deficiency anemia, unspecified: Secondary | ICD-10-CM

## 2020-01-25 DIAGNOSIS — D125 Benign neoplasm of sigmoid colon: Secondary | ICD-10-CM

## 2020-01-25 DIAGNOSIS — K649 Unspecified hemorrhoids: Secondary | ICD-10-CM

## 2020-01-25 LAB — SARS CORONAVIRUS 2 (TAT 6-24 HRS): SARS Coronavirus 2: NEGATIVE

## 2020-01-25 MED ORDER — OMEPRAZOLE 40 MG PO CPDR: 40.0000 mg | DELAYED_RELEASE_CAPSULE | Freq: Every day | ORAL | 3 refills | Status: DC

## 2020-01-25 MED ORDER — SODIUM CHLORIDE 0.9 % IV SOLN
500.0000 mL | Freq: Once | INTRAVENOUS | Status: DC
Start: 2020-01-25 — End: 2020-01-25

## 2020-01-25 MED ORDER — HYDROCORTISONE ACETATE 25 MG RE SUPP: 25.0000 mg | Freq: Every day | RECTAL | 1 refills | Status: AC

## 2020-01-25 NOTE — Op Note (Addendum)
[facility] Patient Name: Nathan Kane Procedure Date: 01/25/2020 8:59 AM MRN: [id number] Endoscopist: Justice Britain , MD Age: 54 Referring MD:  Date of Birth: [birth year] Gender: Male Account #: [id number] Procedure:                Colonoscopy Indications:              Screening for colorectal malignant neoplasm - no                            prior colonoscopies, Incidental - Iron deficiency                            anemia and incidental rectal bleeding Medicines:                Monitored Anesthesia Care Procedure:                Pre-Anesthesia Assessment:                           - Prior to the procedure, a History and Physical                            was performed, and patient medications and                            allergies were reviewed. The patient's tolerance of                            previous anesthesia was also reviewed. The risks                            and benefits of the procedure and the sedation                            options and risks were discussed with the patient.                            All questions were answered, and informed consent                            was obtained. Prior Anticoagulants: The patient has                            taken no previous anticoagulant or antiplatelet                            agents. ASA Grade Assessment: II - A patient with                            mild systemic disease. After reviewing the risks                            and benefits, the patient was deemed in  satisfactory condition to undergo the procedure.                           After obtaining informed consent, the colonoscope                            was passed under direct vision. Throughout the                            procedure, the patient's blood pressure, pulse, and                            oxygen saturations were monitored continuously. The                            Colonoscope was  introduced through the anus and                            advanced to the 5 cm into the ileum. The                            colonoscopy was performed without difficulty. The                            patient tolerated the procedure. The quality of the                            bowel preparation was good. The terminal ileum,                            ileocecal valve, appendiceal orifice, and rectum                            were photographed. Scope In: 9:22:15 AM Scope Out: 9:37:44 AM Scope Withdrawal Time: 0 hours 11 minutes 47 seconds  Total Procedure Duration: 0 hours 15 minutes 29 seconds  Findings:                 The digital rectal exam findings include                            hemorrhoids. Pertinent negatives include no                            palpable rectal lesions.                           The terminal ileum and ileocecal valve appeared                            normal.                           A 5 mm polyp was found in the proximal sigmoid  colon. The polyp was sessile. The polyp was removed                            with a cold snare. Resection and retrieval were                            complete.                           A few small-mouthed diverticula were found in the                            transverse colon and ascending colon.                           Normal mucosa was found in the entire colon                            otherwise.                           Non-bleeding non-thrombosed external and internal                            hemorrhoids were found during retroflexion, during                            perianal exam and during digital exam. The                            hemorrhoids were Grade II (internal hemorrhoids                            that prolapse but reduce spontaneously). Complications:            No immediate complications. Estimated Blood Loss:     Estimated blood loss was minimal. Impression:                - Hemorrhoids found on digital rectal exam.                           - The examined portion of the ileum was normal.                           - One 5 mm polyp in the proximal sigmoid colon,                            removed with a cold snare. Resected and retrieved.                           - Diverticulosis in the transverse colon and in the                            ascending colon.                           -  Normal mucosa in the entire examined colon                            otherwise.                           - Non-bleeding non-thrombosed external and internal                            hemorrhoids. Recommendation:           - The patient will be observed post-procedure,                            until all discharge criteria are met.                           - Discharge patient to home.                           - Patient has a contact number available for                            emergencies. The signs and symptoms of potential                            delayed complications were discussed with the                            patient. Return to normal activities tomorrow.                            Written discharge instructions were provided to the                            patient.                           - Await pathology results.                           - Repeat colonoscopy in 7/10 years for surveillance                            based on pathology results and findings of                            adenomatous tissue.                           - High fiber diet.                           - Continue present medications.                           - Use FiberCon 1 tablet PO daily.                           -  Preparation H cream: Apply externally once to TID.                           - Take Sitz baths QHS.                           - If rectal bleeding recurs, would prescribe Anusol                            suppositories QHS x 1 week and then QOD for next                             2-weeks. Will discuss potential role of internal                            hemorrhoidal banding, though it would not make a                            difference to the external hemorrhoidal skin tags,                            but could be somewhat effective. Will need to                            discuss role of my partners pursuing or sending to                            Colorectal surgery.                           - The findings and recommendations were discussed                            with the patient. Justice Britain, MD 01/25/2020 9:51:03 AM

## 2020-01-25 NOTE — Patient Instructions (Signed)
YOU HAD AN ENDOSCOPIC PROCEDURE TODAY AT THE [facility]:   Refer to the procedure report that was given to you for any specific questions about what was found during the examination.  If the procedure report does not answer your questions, please call your gastroenterologist to clarify.  If you requested that your care partner not be given the details of your procedure findings, then the procedure report has been included in a sealed envelope for you to review at your convenience later.  YOU SHOULD EXPECT: Some feelings of bloating in the abdomen. Passage of more gas than usual.  Walking can help get rid of the air that was put into your GI tract during the procedure and reduce the bloating. If you had a lower endoscopy (such as a colonoscopy or flexible sigmoidoscopy) you may notice spotting of blood in your stool or on the toilet paper. If you underwent a bowel prep for your procedure, you may not have a normal bowel movement for a few days.  Please Note:  You might notice some irritation and congestion in your nose or some drainage.  This is from the oxygen used during your procedure.  There is no need for concern and it should clear up in a day or so.  SYMPTOMS TO REPORT IMMEDIATELY:   Following lower endoscopy (colonoscopy or flexible sigmoidoscopy):  Excessive amounts of blood in the stool  Significant tenderness or worsening of abdominal pains  Swelling of the abdomen that is new, acute  Fever of 100F or higher   Following upper endoscopy (EGD)  Vomiting of blood or coffee ground material  New chest pain or pain under the shoulder blades  Painful or persistently difficult swallowing  New shortness of breath  Fever of 100F or higher  Black, tarry-looking stools  For urgent or emergent issues, a gastroenterologist can be reached at any hour by calling [phone]. Do not use MyChart messaging for urgent concerns.    DIET:  We do recommend a small meal at first, but  then you may proceed to your regular diet.  Drink plenty of fluids but you should avoid alcoholic beverages for 24 hours.  ACTIVITY:  You should plan to take it easy for the rest of today and you should NOT DRIVE or use heavy machinery until tomorrow (because of the sedation medicines used during the test).    FOLLOW UP: Our staff will call the number listed on your records 48-72 hours following your procedure to check on you and address any questions or concerns that you may have regarding the information given to you following your procedure. If we do not reach you, we will leave a message.  We will attempt to reach you two times.  During this call, we will ask if you have developed any symptoms of COVID 19. If you develop any symptoms (ie: fever, flu-like symptoms, shortness of breath, cough etc.) before then, please call [phone].  If you test positive for Covid 19 in the 2 weeks post procedure, please call and report this information to us.    If any biopsies were taken you will be contacted by phone or by letter within the next 1-3 weeks.  Please call us at [phone] if you have not heard about the biopsies in 3 weeks.    SIGNATURES/CONFIDENTIALITY: You and/or your care partner have signed paperwork which will be entered into your electronic medical record.  These signatures attest to the fact that that the information above on   your After Visit Summary has been reviewed and is understood.  Full responsibility of the confidentiality of this discharge information lies with you and/or your care-partner. 

## 2020-01-25 NOTE — Op Note (Addendum)
[facility] Patient Name: Nathan Kane Procedure Date: 01/25/2020 9:00 AM MRN: [id number] Endoscopist: Justice Britain , MD Age: 54 Referring MD:  Date of Birth: 12/19/1965 Gender: Male Account #: [id number] Procedure:                Upper GI endoscopy Indications:              Abdominal pain in the left upper quadrant, Iron                            deficiency anemia, Dyspepsia Medicines:                Monitored Anesthesia Care Procedure:                Pre-Anesthesia Assessment:                           - Prior to the procedure, a History and Physical                            was performed, and patient medications and                            allergies were reviewed. The patient's tolerance of                            previous anesthesia was also reviewed. The risks                            and benefits of the procedure and the sedation                            options and risks were discussed with the patient.                            All questions were answered, and informed consent                            was obtained. Prior Anticoagulants: The patient has                            taken no previous anticoagulant or antiplatelet                            agents. ASA Grade Assessment: II - A patient with                            mild systemic disease. After reviewing the risks                            and benefits, the patient was deemed in                            satisfactory condition to undergo the procedure.  After obtaining informed consent, the endoscope was                            passed under direct vision. Throughout the                            procedure, the patient's blood pressure, pulse, and                            oxygen saturations were monitored continuously. The                            Endoscope was introduced through the mouth, and                            advanced to the second  part of duodenum. The upper                            GI endoscopy was accomplished without difficulty.                            The patient tolerated the procedure. Scope In: Scope Out: Findings:                 No gross lesions were noted in the entire esophagus.                           The Z-line was regular and was found 42 cm from the                            incisors.                           Patchy mildly erythematous mucosa without bleeding                            was found in the gastric antrum.                           No other gross lesions were noted in the entire                            examined stomach. Biopsies were taken with a cold                            forceps for histology and Helicobacter pylori                            testing.                           No gross lesions were noted in the duodenal bulb,                            in the first portion  of the duodenum and in the                            second portion of the duodenum. Biopsies for                            histology were taken with a cold forceps for                            evaluation of celiac disease. Complications:            No immediate complications. Estimated Blood Loss:     Estimated blood loss was minimal. Impression:               - No gross lesions in esophagus. Z-line regular, 42                            cm from the incisors.                           - Erythematous mucosa in the antrum. No other gross                            lesions in the stomach. Biopsied.                           - No gross lesions in the duodenal bulb, in the                            first portion of the duodenum and in the second                            portion of the duodenum. Biopsied. Recommendation:           - Proceed to scheduled colonoscopy.                           - Observe patient's clinical course.                           - Await pathology results. If positive for  HP then                            will need treatment.                           - Recommend initiation of Omeprazole 40 mg daily                            before breakfast or dinner for next 2-3 months, to                            see if it makes any difference with the  post-prandial discomfort that he has had for years.                           - The findings and recommendations were discussed                            with the patient. Justice Britain, MD 01/25/2020 9:44:32 AM

## 2020-01-25 NOTE — Progress Notes (Signed)
Called to room to assist during endoscopic procedure.  Patient ID and intended procedure confirmed with present staff. Received instructions for my participation in the procedure from the performing physician.  

## 2020-01-25 NOTE — Progress Notes (Signed)
Pt's states no medical or surgical changes since previsit or office visit. 

## 2020-01-25 NOTE — Progress Notes (Signed)
Report given to PACU, vss 

## 2020-01-29 ENCOUNTER — Telehealth: Payer: Self-pay

## 2020-01-29 NOTE — Telephone Encounter (Signed)
  Follow up Call-  Call back number 01/25/2020  Post procedure Call Back phone  # [phone]  Permission to leave phone message Yes  Some recent data might be hidden     Patient questions:  Do you have a fever, pain , or abdominal swelling? No. Pain Score  0 *  Have you tolerated food without any problems? Yes.    Have you been able to return to your normal activities? Yes.    Do you have any questions about your discharge instructions: Diet   No. Medications  No. Follow up visit  No.  Do you have questions or concerns about your Care? No.  Actions: * If pain score is 4 or above: No action needed, pain <4.  1. Have you developed a fever since your procedure? no  2.   Have you had an respiratory symptoms (SOB or cough) since your procedure? no  3.   Have you tested positive for COVID 19 since your procedure no  4.   Have you had any family members/close contacts diagnosed with the COVID 19 since your procedure?  no   If yes to any of these questions please route to Joylene John, RN and Erenest Rasher, RN

## 2020-01-30 ENCOUNTER — Other Ambulatory Visit: Payer: Self-pay

## 2020-01-30 ENCOUNTER — Encounter: Payer: Self-pay | Admitting: Gastroenterology

## 2020-01-30 DIAGNOSIS — D509 Iron deficiency anemia, unspecified: Secondary | ICD-10-CM

## 2020-03-07 ENCOUNTER — Ambulatory Visit: Payer: Self-pay | Admitting: Gastroenterology

## 2020-12-05 ENCOUNTER — Other Ambulatory Visit: Payer: Self-pay

## 2020-12-05 ENCOUNTER — Encounter (HOSPITAL_COMMUNITY): Payer: Self-pay | Admitting: Emergency Medicine

## 2020-12-05 ENCOUNTER — Emergency Department (HOSPITAL_COMMUNITY): Payer: Self-pay

## 2020-12-05 ENCOUNTER — Emergency Department (HOSPITAL_COMMUNITY)
Admission: EM | Admit: 2020-12-05 | Discharge: 2020-12-05 | Disposition: A | Discharge status: Home / Self Care | Payer: Self-pay | Attending: Emergency Medicine | Admitting: Emergency Medicine

## 2020-12-05 DIAGNOSIS — R945 Abnormal results of liver function studies: Secondary | ICD-10-CM | POA: Insufficient documentation

## 2020-12-05 DIAGNOSIS — R110 Nausea: Secondary | ICD-10-CM | POA: Insufficient documentation

## 2020-12-05 DIAGNOSIS — R748 Abnormal levels of other serum enzymes: Secondary | ICD-10-CM

## 2020-12-05 DIAGNOSIS — R197 Diarrhea, unspecified: Secondary | ICD-10-CM | POA: Insufficient documentation

## 2020-12-05 DIAGNOSIS — R1013 Epigastric pain: Secondary | ICD-10-CM | POA: Insufficient documentation

## 2020-12-05 LAB — CBC WITH DIFFERENTIAL/PLATELET
Abs Immature Granulocytes: 0.01 10*3/uL (ref 0.00–0.07)
Basophils Absolute: 0 10*3/uL (ref 0.0–0.1)
Basophils Relative: 1 %
Eosinophils Absolute: 0.1 10*3/uL (ref 0.0–0.5)
Eosinophils Relative: 3 %
HCT: 36.8 % — ABNORMAL LOW (ref 39.0–52.0)
Hemoglobin: 12 g/dL — ABNORMAL LOW (ref 13.0–17.0)
Immature Granulocytes: 0 %
Lymphocytes Relative: 24 %
Lymphs Abs: 0.8 10*3/uL (ref 0.7–4.0)
MCH: 30.4 pg (ref 26.0–34.0)
MCHC: 32.6 g/dL (ref 30.0–36.0)
MCV: 93.2 fL (ref 80.0–100.0)
Monocytes Absolute: 0.5 10*3/uL (ref 0.1–1.0)
Monocytes Relative: 13 %
Neutro Abs: 2.1 10*3/uL (ref 1.7–7.7)
Neutrophils Relative %: 59 %
Platelets: 213 10*3/uL (ref 150–400)
RBC: 3.95 MIL/uL — ABNORMAL LOW (ref 4.22–5.81)
RDW: 16.8 % — ABNORMAL HIGH (ref 11.5–15.5)
WBC: 3.6 10*3/uL — ABNORMAL LOW (ref 4.0–10.5)
nRBC: 0 % (ref 0.0–0.2)

## 2020-12-05 LAB — COMPREHENSIVE METABOLIC PANEL
ALT: 1553 U/L — ABNORMAL HIGH (ref 0–44)
AST: 650 U/L — ABNORMAL HIGH (ref 15–41)
Albumin: 4 g/dL (ref 3.5–5.0)
Alkaline Phosphatase: 93 U/L (ref 38–126)
Anion gap: 8 (ref 5–15)
BUN: 6 mg/dL (ref 6–20)
CO2: 26 mmol/L (ref 22–32)
Calcium: 9.5 mg/dL (ref 8.9–10.3)
Chloride: 101 mmol/L (ref 98–111)
Creatinine, Ser: 0.8 mg/dL (ref 0.61–1.24)
GFR, Estimated: >60 mL/min (ref >60)
Glucose, Bld: 138 mg/dL — ABNORMAL HIGH (ref 70–99)
Potassium: 3.5 mmol/L (ref 3.5–5.1)
Sodium: 135 mmol/L (ref 135–145)
Total Bilirubin: 3.3 mg/dL — ABNORMAL HIGH (ref 0.3–1.2)
Total Protein: 6.6 g/dL (ref 6.5–8.1)

## 2020-12-05 LAB — URINALYSIS, ROUTINE W REFLEX MICROSCOPIC
Bilirubin Urine: NEGATIVE
Glucose, UA: NEGATIVE mg/dL
Hgb urine dipstick: NEGATIVE
Ketones, ur: NEGATIVE mg/dL
Leukocytes,Ua: NEGATIVE
Nitrite: NEGATIVE
Protein, ur: NEGATIVE mg/dL
Specific Gravity, Urine: 1.003 — ABNORMAL LOW (ref 1.005–1.030)
pH: 6 (ref 5.0–8.0)

## 2020-12-05 LAB — PROTIME-INR
INR: 1 (ref 0.8–1.2)
Prothrombin Time: 12.6 seconds (ref 11.4–15.2)

## 2020-12-05 LAB — HEPATITIS PANEL, ACUTE
HCV Ab: NONREACTIVE
Hep A IgM: NONREACTIVE
Hep B C IgM: NONREACTIVE
Hepatitis B Surface Ag: NONREACTIVE

## 2020-12-05 LAB — APTT: aPTT: 30 seconds (ref 24–36)

## 2020-12-05 LAB — LIPASE, BLOOD: Lipase: 56 U/L — ABNORMAL HIGH (ref 11–51)

## 2020-12-05 LAB — ACETAMINOPHEN LEVEL: Acetaminophen (Tylenol), Serum: <10 ug/mL — ABNORMAL LOW (ref 10–30)

## 2020-12-05 MED ORDER — IOHEXOL 300 MG/ML  SOLN
100.0000 mL | Freq: Once | INTRAMUSCULAR | Status: AC | PRN
  Administered 2020-12-05: 100 mL via INTRAVENOUS

## 2020-12-05 NOTE — ED Triage Notes (Signed)
Patient went to PCP with complaint of abdominal pain and was sent for lab work. Sent to ED due AST of 9034, ALT of 3630, bilirubin of 3.0. Patient alert, oriented, and in no apparent distress at this time. Patient has printed results in hand.

## 2020-12-05 NOTE — ED Notes (Signed)
E-signature pad unavailable at time of pt discharge. This RN discussed discharge materials with pt and answered all pt questions. Pt stated understanding of discharge material. ? ?

## 2020-12-05 NOTE — Discharge Instructions (Addendum)
-  Call the office of [facility] Monday morning.  You need follow-up for your elevated liver functions today.  Abnormal labs could be caused by recent viral illness.  You should not drink alcohol or take any Tylenol.  Follow-up with your primary care doctor as needed.  Return to the emergency department if you have any new or worsening symptoms.

## 2020-12-05 NOTE — ED Provider Notes (Signed)
[facility] EMERGENCY DEPARTMENT Provider Note   CSN: [id number] Arrival date & time: 12/05/20  1736     History Chief Complaint  Patient presents with  . Abnormal Lab    Nathan Kane is a 55 y.o. male with past medical history significant for iron deficiency anemia, hyperlipidemia.  Has been seen by [facility].  HPI Patient presents to emergency room today with chief complaint of abnormal lab.  Patient saw PCP earleir this week, approximately 3 days ago for abdominal pain and had blood drawn.  He was given Zofran which he has been taking intermittently.  No other new medications recently.  He was informed of abnormal results including AST of 9034, ALT of 3630, and bilirubin of 3.0.  Patient denies being in any pain currently.  He states he has had some intermittent nausea, no emesis today.  He did have diarrhea x4 days ago.  He describes it as loose stool, nonbloody. Admits to drinking alcohol, 6-7 beers a night.  Has not had any alcohol in the last x 1 week.  Denies any abdominal pain currently.  He states when he did have it was located in epigastric area and felt like an aching sensation that did not radiate. Denies any tylenol use. Denies any fever, chills, shortness of breath, hemoptysis, hematemesis, palpitations, syncope, chest pain, gross hematuria, urinary frequency, black tarry stool. He is unsure if he has had any weight loss recently, does admit his jeans feel looser than usual. Denies family history of GI cancers.    Past Medical History:  Diagnosis Date  . Hyperlipemia     Patient Active Problem List   Diagnosis Date Noted  . Bilateral foot pain 01/18/2017  . Plantar warts 01/18/2017  . Elevated blood pressure reading 01/18/2017  . Fall 06/24/2012  . Multiple fractures of ribs of left side 06/24/2012  . Lumbar transverse process fracture ([place]) 06/24/2012    Past Surgical History:  Procedure Laterality Date  . KNEE ARTHROSCOPY     left  .  OPEN REDUCTION INTERNAL FIXATION (ORIF) DISTAL RADIAL FRACTURE Left 05/27/2017   Procedure: OPEN REDUCTION INTERNAL FIXATION (ORIF) DISTAL RADIAL FRACTURE;  Surgeon: Iran Planas, MD;  Location: [facility];  Service: Orthopedics;  Laterality: Left;       Family History  Problem Relation Age of Onset  . Colon cancer Neg Hx   . Esophageal cancer Neg Hx   . Pancreatic cancer Neg Hx   . Stomach cancer Neg Hx   . Rectal cancer Neg Hx     Social History   Tobacco Use  . Smoking status: Never Smoker  . Smokeless tobacco: Never Used  Vaping Use  . Vaping Use: Never used  Substance Use Topics  . Alcohol use: Yes    Alcohol/week: 4.0 standard drinks    Types: 4 Cans of beer per week    Comment: 4 to 5 days a week  . Drug use: No    Home Medications Prior to Admission medications   Medication Sig Start Date End Date Taking? Authorizing Provider  amoxicillin (AMOXIL) 500 MG capsule Take 500 mg by mouth 3 (three) times daily. Start date : 12/02/20 12/02/20  Yes [provider]  omeprazole (PRILOSEC) 40 MG capsule Take 1 capsule (40 mg total) by mouth daily. Can take before breakfast of before dinner for 2-3 months. Patient taking differently: Take 40 mg by mouth daily. 01/25/20  Yes Mansouraty, Telford Nab., MD  promethazine (PHENERGAN) 25 MG tablet Take 25 mg  by mouth every 8 (eight) hours as needed for nausea or vomiting. 12/02/20  Yes [provider]  vitamin C (ASCORBIC ACID) 500 MG tablet Take 1 tablet (500 mg total) by mouth daily. Patient not taking: No sig reported 05/27/17   Iran Planas, MD    Allergies    Patient has no known allergies.  Review of Systems   Review of Systems All other systems are reviewed and are negative for acute change except as noted in the HPI.  Physical Exam Updated Vital Signs BP 123/87   Pulse 79   Temp 99 F (37.2 C)   Resp 14   SpO2 97%   Physical Exam Vitals and nursing note reviewed.  Constitutional:      General: He is  not in acute distress.    Appearance: He is not ill-appearing.  HENT:     Head: Normocephalic and atraumatic.     Right Ear: Tympanic membrane and external ear normal.     Left Ear: Tympanic membrane and external ear normal.     Nose: Nose normal.     Mouth/Throat:     Mouth: Mucous membranes are moist.     Pharynx: Oropharynx is clear.  Eyes:     General: No scleral icterus (mild).       Right eye: No discharge.        Left eye: No discharge.     Extraocular Movements: Extraocular movements intact.     Conjunctiva/sclera: Conjunctivae normal.     Pupils: Pupils are equal, round, and reactive to light.  Neck:     Vascular: No JVD.  Cardiovascular:     Rate and Rhythm: Normal rate and regular rhythm.     Pulses: Normal pulses.          Radial pulses are 2+ on the right side and 2+ on the left side.     Heart sounds: Normal heart sounds.  Pulmonary:     Comments: Lungs clear to auscultation in all fields. Symmetric chest rise. No wheezing, rales, or rhonchi. Abdominal:     General: Bowel sounds are normal.     Tenderness: There is no right CVA tenderness or left CVA tenderness.     Comments: Abdomen is soft, non-distended, and non-tender in all quadrants. No rigidity, no guarding. No peritoneal signs.  Musculoskeletal:        General: Normal range of motion.     Cervical back: Normal range of motion.  Skin:    General: Skin is warm and dry.     Capillary Refill: Capillary refill takes less than 2 seconds.     Coloration: Skin is not pale.     Findings: No rash.  Neurological:     Mental Status: He is oriented to person, place, and time.     GCS: GCS eye subscore is 4. GCS verbal subscore is 5. GCS motor subscore is 6.     Comments: Fluent speech, no facial droop.  Psychiatric:        Behavior: Behavior normal.     ED Results / Procedures / Treatments   Labs (all labs ordered are listed, but only abnormal results are displayed) Labs Reviewed  CBC WITH  DIFFERENTIAL/PLATELET - Abnormal; Notable for the following components:      Result Value   WBC 3.6 (*)    RBC 3.95 (*)    Hemoglobin 12.0 (*)    HCT 36.8 (*)    RDW 16.8 (*)    All other components  within normal limits  COMPREHENSIVE METABOLIC PANEL - Abnormal; Notable for the following components:   Glucose, Bld 138 (*)    AST 650 (*)    ALT 1,553 (*)    Total Bilirubin 3.3 (*)    All other components within normal limits  LIPASE, BLOOD - Abnormal; Notable for the following components:   Lipase 56 (*)    All other components within normal limits  URINALYSIS, ROUTINE W REFLEX MICROSCOPIC - Abnormal; Notable for the following components:   Specific Gravity, Urine 1.003 (*)    All other components within normal limits  ACETAMINOPHEN LEVEL - Abnormal; Notable for the following components:   Acetaminophen (Tylenol), Serum <10 (*)    All other components within normal limits  HEPATITIS PANEL, ACUTE  PROTIME-INR  APTT    EKG None  Radiology CT ABDOMEN PELVIS W CONTRAST  Result Date: 12/05/2020 CLINICAL DATA:  Epigastric abdominal pain, elevated LFTs EXAM: CT ABDOMEN AND PELVIS WITH CONTRAST TECHNIQUE: Multidetector CT imaging of the abdomen and pelvis was performed using the standard protocol following bolus administration of intravenous contrast. CONTRAST:  17mL OMNIPAQUE IOHEXOL 300 MG/ML  SOLN COMPARISON:  06/23/2012 FINDINGS: Lower chest: No acute abnormality. Hepatobiliary: No solid liver abnormality is seen. No gallstones, gallbladder wall thickening, or biliary dilatation. Pancreas: Unremarkable. No pancreatic ductal dilatation or surrounding inflammatory changes. Spleen: Normal in size without significant abnormality. Adrenals/Urinary Tract: Adrenal glands are unremarkable. Kidneys are normal, without renal calculi, solid lesion, or hydronephrosis. Bladder is unremarkable. Stomach/Bowel: Stomach is within normal limits. Appendix appears normal. No evidence of bowel wall  thickening, distention, or inflammatory changes. Vascular/Lymphatic: No significant vascular findings are present. No enlarged abdominal or pelvic lymph nodes. Reproductive: Mild prostatomegaly. Other: No abdominal wall hernia or abnormality. No abdominopelvic ascites. Musculoskeletal: No acute or significant osseous findings. IMPRESSION: 1. No acute CT findings of the abdomen or pelvis to explain epigastric pain or abnormal LFTs. 2. No biliary ductal dilatation. 3. Mild prostatomegaly. Electronically Signed   By: Eddie Candle M.D.   On: 12/05/2020 20:33    Procedures Procedures   Medications Ordered in ED Medications  iohexol (OMNIPAQUE) 300 MG/ML solution 100 mL (100 mLs Intravenous Contrast Given 12/05/20 2012)    ED Course  I have reviewed the triage vital signs and the nursing notes.  Pertinent labs & imaging results that were available during my care of the patient were reviewed by me and considered in my medical decision making (see chart for details).    MDM Rules/Calculators/A&P                          History provided by patient with additional history obtained from chart review.    Presenting with abnormal labs.  On ED arrival he is afebrile, hemodynamically stable. Nontoxic appearing. Patient is alert and oriented. No signs of encephalopathy. He has no abdominal tenderness, no peritoneal signs. He is not complaining of any symptoms currently. CMP shows AST of 650 and ALT of 1553.  This is compared to x3 days ago when AST was 9034 and ALT was 3630.  Patient has not had alcohol to drink in the last x1 week.  Bilirubin is 3.3.  CBC with leukopenia 3.6, hemoglobin 12.0 with normal platelet count.  Lipase slightly elevated at 56. Acute hepatitis panel collected.  INR normal at 1, PTT normal at 30, tylenol level undetectable. UA without signs of infection.  CT abdomen pelvis viewed by me shows no acute findings.  He has no biliary ductal dilation.  No findings to suggest  pancreatitis. Given transaminitis consulted on-call GI for lower and discussed case with Dr. Benson Norway who feels patient is safe for outpatient follow-up as he is not encephalopathic and has benign abdominal exam. Updated patient on plan.  He is tolerating p.o. intake.  Serial abdominal exams have been benign. No indication of acute surgical abdomen.  He is agreeable with plan of care.  The patient appears reasonably screened and/or stabilized for discharge and I doubt any other medical condition or other [facility] requiring further screening, evaluation, or treatment in the ED at this time prior to discharge. The patient is safe for discharge with strict return precautions discussed.   Portions of this note were generated with [employer]. Dictation errors may occur despite best attempts at proofreading.   Final Clinical Impression(s) / ED Diagnoses Final diagnoses:  Abnormal liver enzymes    Rx / DC Orders ED Discharge Orders    None       Lewanda Rife 12/05/20 2319    Dorie Rank, MD 12/09/20 [phone]

## 2020-12-09 ENCOUNTER — Telehealth: Payer: Self-pay

## 2020-12-09 NOTE — Telephone Encounter (Signed)
-----   Message from Levin Erp, [place] sent at 12/08/2020  9:00 AM EDT ----- Regarding: follow up Patient needs follow up scheduled, can you please call and schedule? Thanks! JLL ----- Message ----- From: Carol Ada, MD Sent: 12/06/2020   1:05 PM EDT To: Levin Erp, PA  Anderson Malta,   This patient was sent to the ER by his PCP.  He was stable and he did not have any complaints, per the ER.  I did not see the patient, but the called me asking me about an outpatient follow up with you.  His liver enzymes were reportedly declining from 9,000 and he was not encephalopathic.  Please arrange for a follow up with you.  Thanks!  Dr. Benson Norway

## 2020-12-09 NOTE — Telephone Encounter (Signed)
Spoke with patient, he called and scheduled a follow up appt. He is scheduled with Dr. Rush Landmark on 01/14/21 at 10:10 am. Offered patient a sooner appt with Anderson Malta but he was fine with his scheduled appt. Advised if he had any concerns before his office visit to give [place] a call, I provided him with the office number. Patient verbalized understanding and had no concerns at the end of the call.

## 2021-01-14 ENCOUNTER — Ambulatory Visit (INDEPENDENT_AMBULATORY_CARE_PROVIDER_SITE_OTHER): Payer: Self-pay | Admitting: Gastroenterology

## 2021-01-14 ENCOUNTER — Encounter: Payer: Self-pay | Admitting: Gastroenterology

## 2021-01-14 ENCOUNTER — Other Ambulatory Visit (INDEPENDENT_AMBULATORY_CARE_PROVIDER_SITE_OTHER): Payer: Self-pay

## 2021-01-14 VITALS — BP 100/70 | HR 88 | Ht 68.0 in | Wt 175.2 lb

## 2021-01-14 DIAGNOSIS — R7989 Other specified abnormal findings of blood chemistry: Secondary | ICD-10-CM

## 2021-01-14 DIAGNOSIS — B179 Acute viral hepatitis, unspecified: Secondary | ICD-10-CM

## 2021-01-14 DIAGNOSIS — Z7289 Other problems related to lifestyle: Secondary | ICD-10-CM

## 2021-01-14 DIAGNOSIS — Z789 Other specified health status: Secondary | ICD-10-CM

## 2021-01-14 DIAGNOSIS — R945 Abnormal results of liver function studies: Secondary | ICD-10-CM

## 2021-01-14 DIAGNOSIS — F1020 Alcohol dependence, uncomplicated: Secondary | ICD-10-CM

## 2021-01-14 LAB — CBC
HCT: 37.9 % — ABNORMAL LOW (ref 39.0–52.0)
Hemoglobin: 13.2 g/dL (ref 13.0–17.0)
MCHC: 34.7 g/dL (ref 30.0–36.0)
MCV: 91.1 fl (ref 78.0–100.0)
Platelets: 301 10*3/uL (ref 150.0–400.0)
RBC: 4.16 Mil/uL — ABNORMAL LOW (ref 4.22–5.81)
RDW: 15.3 % (ref 11.5–15.5)
WBC: 2.7 10*3/uL — ABNORMAL LOW (ref 4.0–10.5)

## 2021-01-14 LAB — COMPREHENSIVE METABOLIC PANEL
ALT: 22 U/L (ref 0–53)
AST: 28 U/L (ref 0–37)
Albumin: 4.8 g/dL (ref 3.5–5.2)
Alkaline Phosphatase: 52 U/L (ref 39–117)
BUN: 12 mg/dL (ref 6–23)
CO2: 28 mEq/L (ref 19–32)
Calcium: 9.6 mg/dL (ref 8.4–10.5)
Chloride: 101 mEq/L (ref 96–112)
Creatinine, Ser: 0.73 mg/dL (ref 0.40–1.50)
GFR: 104.41 mL/min (ref >60.00)
Glucose, Bld: 78 mg/dL (ref 70–99)
Potassium: 3.9 mEq/L (ref 3.5–5.1)
Sodium: 139 mEq/L (ref 135–145)
Total Bilirubin: 0.9 mg/dL (ref 0.2–1.2)
Total Protein: 7.5 g/dL (ref 6.0–8.3)

## 2021-01-14 LAB — FERRITIN: Ferritin: 9.7 ng/mL — ABNORMAL LOW (ref 22.0–322.0)

## 2021-01-14 LAB — GAMMA GT: GGT: 71 U/L — ABNORMAL HIGH (ref 7–51)

## 2021-01-14 LAB — TSH: TSH: 1.35 u[IU]/mL (ref 0.35–4.50)

## 2021-01-14 LAB — CK: Total CK: 105 U/L (ref 7–232)

## 2021-01-14 NOTE — Patient Instructions (Signed)
Try your best to stop drinking. At least decrease your beer intake to 3-4 beers daily, then 1 beer less every 2 weeks.   Please keep your follow up appointment on 03/05/21 @ 1:50pm with Dr. Rush Landmark   If you are age 53 or older, your body mass index should be between 23-30. Your Body mass index is 26.65 kg/m. If this is out of the aforementioned range listed, please consider follow up with your Primary Care Provider.  If you are age 64 or younger, your body mass index should be between 19-25. Your Body mass index is 26.65 kg/m. If this is out of the aformentioned range listed, please consider follow up with your Primary Care Provider.    Your provider has requested that you go to the basement level for lab work before leaving today. Press "B" on the elevator. The lab is located at the first door on the left as you exit the elevator.  Due to recent changes in healthcare laws, you may see the results of your imaging and laboratory studies on MyChart before your provider has had a chance to review them.  We understand that in some cases there may be results that are confusing or concerning to you. Not all laboratory results come back in the same time frame and the provider may be waiting for multiple results in order to interpret others.  Please give [place] 48 hours in order for your provider to thoroughly review all the results before contacting the office for clarification of your results.   Thank you for choosing me and [facility].  Dr. Rush Landmark

## 2021-01-15 LAB — TISSUE TRANSGLUTAMINASE, IGA: (tTG) Ab, IgA: <1 U/mL

## 2021-01-15 LAB — HEPATITIS B SURFACE ANTIBODY,QUALITATIVE: Hep B S Ab: NONREACTIVE

## 2021-01-15 LAB — IGA: Immunoglobulin A: 209 mg/dL (ref 47–310)

## 2021-01-15 LAB — HEPATITIS B CORE ANTIBODY, TOTAL: Hep B Core Total Ab: NONREACTIVE

## 2021-01-16 ENCOUNTER — Encounter: Payer: Self-pay | Admitting: Gastroenterology

## 2021-01-16 ENCOUNTER — Other Ambulatory Visit: Payer: Self-pay

## 2021-01-16 DIAGNOSIS — R945 Abnormal results of liver function studies: Secondary | ICD-10-CM

## 2021-01-16 DIAGNOSIS — F1020 Alcohol dependence, uncomplicated: Secondary | ICD-10-CM | POA: Insufficient documentation

## 2021-01-16 DIAGNOSIS — B179 Acute viral hepatitis, unspecified: Secondary | ICD-10-CM | POA: Insufficient documentation

## 2021-01-16 DIAGNOSIS — D509 Iron deficiency anemia, unspecified: Secondary | ICD-10-CM

## 2021-01-16 DIAGNOSIS — R7989 Other specified abnormal findings of blood chemistry: Secondary | ICD-10-CM | POA: Insufficient documentation

## 2021-01-16 NOTE — Progress Notes (Signed)
[facility] VISIT   Primary Care Provider Patient, No Pcp Per (Inactive) No address on file None  Patient Profile: Nathan Kane is a 55 y.o. male with a pmh significant for obesity, chronic alcohol use disorder, colon polyps (TAs), hemorrhoids, diverticulosis, gastritis (negative for HP).  The patient presents to the [facility] for an evaluation and management of problem(s) noted below:  Problem List 1. Acute hepatitis   2. Abnormal LFTs   3. Alcohol use disorder, moderate, dependence ([place])     History of Present Illness Please see initial consultation note by [facility] for full details of HPI.  Interval History The patient presented in March to the emergency department with abdominal discomfort, malaise, fatigue after being evaluated by his PCP and being found to have significantly elevated transferases (reportedly with transferase is in the 9000 and AST level, 3600 ALT level, and bilirubin of 3).  He underwent cross-sectional imaging as well as repeat laboratories which showed a significant downtrend but persistence in bilirubin elevation and transferases.  CT scan showed no evidence of biliary obstruction.  Patient was referred back to PCP and to have potential GI follow-up.  I have not seen this patient since his upper and lower endoscopy performed in 2021.  Is accompanied today by his wife.  Patient states that he cut down on his alcohol consumption for period in time and now is currently feeling back to normal.  He drinks between 6-8 beers on a daily basis and sometimes more on the weekends.  His alcohol use has been longstanding.  He has a history of a previous DUI.  Patient states that his urine is back to normal colored.  He denies any pruritus.  He has no significant symptoms otherwise.  If he had not been told to follow-up with GI he would not be coming in today.  He never obtained his follow-up anemia labs from last year after his  procedures.  GI Review of Systems Positive as above Negative for pyrosis, dysphagia, odynophagia, nausea, vomiting, bloating, pain, change in bowel habits, melena, hematochezia  Review of Systems General: Denies fevers/chills/weight loss unintentionally Cardiovascular: Denies chest pain Pulmonary: Denies shortness of breath Gastroenterological: See HPI Genitourinary: Denies darkened urine Hematological: Denies easy bruising/bleeding Endocrine: Denies temperature intolerance Dermatological: Denies jaundice Psychological: Mood is stable   Medications No current outpatient medications on file.   No current facility-administered medications for this visit.    Allergies No Known Allergies  Histories Past Medical History:  Diagnosis Date  . Hyperlipemia    Past Surgical History:  Procedure Laterality Date  . KNEE ARTHROSCOPY     left  . OPEN REDUCTION INTERNAL FIXATION (ORIF) DISTAL RADIAL FRACTURE Left 05/27/2017   Procedure: OPEN REDUCTION INTERNAL FIXATION (ORIF) DISTAL RADIAL FRACTURE;  Surgeon: Iran Planas, MD;  Location: [facility];  Service: Orthopedics;  Laterality: Left;   Social History   Socioeconomic History  . Marital status: Married    Spouse name: Not on file  . Number of children: Not on file  . Years of education: Not on file  . Highest education level: Not on file  Occupational History  . Not on file  Tobacco Use  . Smoking status: Never Smoker  . Smokeless tobacco: Never Used  Vaping Use  . Vaping Use: Never used  Substance and Sexual Activity  . Alcohol use: Yes    Alcohol/week: 4.0 standard drinks    Types: 4 Cans of beer per week    Comment: 4 to  5 days a week  . Drug use: No  . Sexual activity: Not on file  Other Topics Concern  . Not on file  Social History Narrative  . Not on file   Social Determinants of Health   Financial Resource Strain: Not on file  Food Insecurity: Not on file  Transportation Needs: Not on file  Physical  Activity: Not on file  Stress: Not on file  Social Connections: Not on file  Intimate Partner Violence: Not on file   Family History  Problem Relation Age of Onset  . Colon cancer Neg Hx   . Esophageal cancer Neg Hx   . Stomach cancer Neg Hx   . Rectal cancer Neg Hx   . Inflammatory bowel disease Neg Hx   . Liver disease Neg Hx   . Pancreatic cancer Neg Hx    I have reviewed his medical, social, and family history in detail and updated the electronic medical record as necessary.    PHYSICAL EXAMINATION  BP 100/70 (BP Location: Left Arm, Patient Position: Sitting, Cuff Size: Normal)   Pulse 88   Ht _0  (1.727 m) Comment: height measured without shoes  Wt 175 lb 4 oz (79.5 kg)   BMI 26.65 kg/m  Wt Readings from Last 3 Encounters:  01/14/21 175 lb 4 oz (79.5 kg)  12/05/20 164 lb 8 oz (74.6 kg)  01/25/20 183 lb (83 kg)  GEN: NAD, appears stated age, doesn't appear chronically ill PSYCH: Cooperative, without pressured speech EYE: Conjunctivae pink, sclerae anicteric ENT: MMM, without oral ulcers CV: Nontachycardic RESP: No audible wheezing GI: NABS, soft, NT/ND, without rebound or guarding, no HSM appreciated MSK/EXT: No lower extremity edema SKIN: No jaundice, no spider angiomata NEURO:  Alert & Oriented x 3, no focal deficits, no evidence of asterixis   REVIEW OF DATA  I reviewed the following data at the time of this encounter:  GI Procedures and Studies  May 2021 EGD - No gross lesions in esophagus. Z-line regular, 42 cm from the incisors. - Erythematous mucosa in the antrum. No other gross lesions in the stomach. Biopsied. - No gross lesions in the duodenal bulb, in the first portion of the duodenum and in the second portion of the duodenum. Biopsied.  May 2021 colonoscopy - Hemorrhoids found on digital rectal exam. - The examined portion of the ileum was normal. - One 5 mm polyp in the proximal sigmoid colon, removed with a cold snare. Resected  and retrieved. - Diverticulosis in the transverse colon and in the ascending colon. - Normal mucosa in the entire examined colon otherwise. - Non-bleeding non-thrombosed external and internal hemorrhoids.  Pathology Diagnosis 1. Surgical [P], duodenal bulb, 2nd portion of duodenum and distal duodenum - PEPTIC DUODENITIS. - NO DYSPLASIA OR MALIGNANCY. 2. Surgical [P], stomach, fundus, gastric antrum and gastric body - MILD CHRONIC GASTRITIS. - WARTHIN-STARRY IS NEGATIVE FOR HELICOBACTER PYLORI. - NO INTESTINAL METAPLASIA, DYSPLASIA, OR MALIGNANCY. 3. Surgical [P], colon, sigmoid, polyp - TUBULAR ADENOMA (X2 FRAGMENTS). - NO HIGH GRADE DYSPLASIA OR MALIGNANCY.  Laboratory Studies  Reviewed those in epic  Imaging Studies  March 2022 CT abdomen pelvis with contrast IMPRESSION: 1. No acute CT findings of the abdomen or pelvis to explain epigastric pain or abnormal LFTs. 2. No biliary ductal dilatation. 3. Mild prostatomegaly.   ASSESSMENT  Mr. Dority is a 55 y.o. male with a pmh significant for obesity, chronic alcohol use disorder, colon polyps (TAs), hemorrhoids, diverticulosis, gastritis (negative for HP).  The patient is  seen today for evaluation and management of:  1. Acute hepatitis   2. Abnormal LFTs   3. Alcohol use disorder, moderate, dependence ([place])    The patient is hemodynamically stable.  Clinically, the patient has evidence of alcohol use disorder with complications.  The etiology of his acute hepatitis is not clear though I suspect some sort of viral process most likely was occurring.  He cannot give a history that is suggestive of a ischemic hepatopathy and although he was having pain there was no evidence of any bile duct or gallbladder issues at the time.  He has not had any recurrence now.  We will repeat some basic labs today and see if he will need an extended liver biochemical testing profile.  A liver biopsy may need to be considered if liver test elevations  were to persist for 4 to 6 months.  Most importantly, the patient needs to cut back and eventually cessation of all alcohol consumption.  Patient states that he can stop this as he has in the past though his wife states he is only for short period in time.  We will see how his labs look and decide upon further work-up that would be more urgent versus laboratories that could be rechecked in the course of 6 to 8 weeks.  All patient questions were answered to the best of my ability, and the patient agrees to the aforementioned plan of action with follow-up as indicated.   PLAN  Laboratories as outlined below Decision will be made if further laboratories and an extended liver biochemical profile will be necessary based on findings See if patient is anemic at which point we may need to consider further anemia studies If patient were to have evidence of persistent iron deficiency then consideration of video capsule endoscopy will be had Will consider right upper quadrant ultrasound Colonoscopy for surveillance in 2028 Alcohol cut back and cessation is most important for patient and he was counseled   Orders Placed This Encounter  Procedures  . CBC  . Comp Met (CMET)  . TSH  . Ferritin  . Tissue transglutaminase, IgA  . Hepatitis B Surface AntiBODY  . Hepatitis B Core Antibody, total  . CK (Creatine Kinase)  . Gamma GT  . IgA    New Prescriptions   No medications on file   Modified Medications   No medications on file    Planned Follow Up No follow-ups on file.   Total Time in Face-to-Face and in Coordination of Care for patient including independent/personal interpretation/review of prior testing, medical history, examination, medication adjustment, communicating results with the patient directly, and documentation with the EHR is 30 minutes.   Justice Britain, MD [facility] Office # [phone]

## 2021-03-05 ENCOUNTER — Other Ambulatory Visit (INDEPENDENT_AMBULATORY_CARE_PROVIDER_SITE_OTHER): Payer: Self-pay

## 2021-03-05 ENCOUNTER — Other Ambulatory Visit: Payer: Self-pay

## 2021-03-05 ENCOUNTER — Ambulatory Visit: Payer: Self-pay | Admitting: Gastroenterology

## 2021-03-05 ENCOUNTER — Telehealth: Payer: Self-pay

## 2021-03-05 ENCOUNTER — Encounter: Payer: Self-pay | Admitting: Gastroenterology

## 2021-03-05 VITALS — BP 124/74 | HR 81 | Ht 68.0 in | Wt 186.0 lb

## 2021-03-05 DIAGNOSIS — R7989 Other specified abnormal findings of blood chemistry: Secondary | ICD-10-CM

## 2021-03-05 DIAGNOSIS — F1020 Alcohol dependence, uncomplicated: Secondary | ICD-10-CM

## 2021-03-05 LAB — COMPREHENSIVE METABOLIC PANEL
ALT: 21 U/L (ref 0–53)
AST: 22 U/L (ref 0–37)
Albumin: 4.8 g/dL (ref 3.5–5.2)
Alkaline Phosphatase: 50 U/L (ref 39–117)
BUN: 14 mg/dL (ref 6–23)
CO2: 28 mEq/L (ref 19–32)
Calcium: 9.6 mg/dL (ref 8.4–10.5)
Chloride: 102 mEq/L (ref 96–112)
Creatinine, Ser: 0.72 mg/dL (ref 0.40–1.50)
GFR: 104.75 mL/min (ref >60.00)
Glucose, Bld: 88 mg/dL (ref 70–99)
Potassium: 3.8 mEq/L (ref 3.5–5.1)
Sodium: 138 mEq/L (ref 135–145)
Total Bilirubin: 0.6 mg/dL (ref 0.2–1.2)
Total Protein: 7.4 g/dL (ref 6.0–8.3)

## 2021-03-05 LAB — GAMMA GT: GGT: 33 U/L (ref 7–51)

## 2021-03-05 LAB — PROTIME-INR
INR: 1 ratio (ref 0.8–1.0)
Prothrombin Time: 11 s (ref 9.6–13.1)

## 2021-03-05 NOTE — Telephone Encounter (Signed)
-----   Message from Irving Copas., MD sent at 03/05/2021  4:35 PM EDT ----- Chong Sicilian or covering RN, Please reach out to the patient and let him know that I have reviewed his labs. Since his significant decrease in alcohol consumption, his liver tests have normalized.  I recommend he continue to remain on a low alcohol consumption from a lifestyle perspective as it seems important for his liver health. Lets plan to see him back in 3 months. Thanks. GM

## 2021-03-05 NOTE — Patient Instructions (Addendum)
If you are age 52 or older, your body mass index should be between 23-30. Your Body mass index is 28.28 kg/m. If this is out of the aforementioned range listed, please consider follow up with your Primary Care Provider.  If you are age 82 or younger, your body mass index should be between 19-25. Your Body mass index is 28.28 kg/m. If this is out of the aformentioned range listed, please consider follow up with your Primary Care Provider.   __________________________________________________________  The [place] providers would like to encourage you to use [facility] to communicate with providers for non-urgent requests or questions.  Due to long hold times on the telephone, sending your provider a message by [facility] may be a faster and more efficient way to get a response.  Please allow 48 business hours for a response.  Please remember that this is for non-urgent requests.   Due to recent changes in healthcare laws, you may see the results of your imaging and laboratory studies on MyChart before your provider has had a chance to review them.  We understand that in some cases there may be results that are confusing or concerning to you. Not all laboratory results come back in the same time frame and the provider may be waiting for multiple results in order to interpret others.  Please give [place] 48 hours in order for your provider to thoroughly review all the results before contacting the office for clarification of your results.   Your provider has requested that you go to the basement level for lab work before leaving today. Press "B" on the elevator. The lab is located at the first door on the left as you exit the elevator.  We will call you tomorrow with lab results, we will let you know at that time if additional or repeat labs needed at that time  CONGRATULATIONS on your alcohol decrease!!!  Please keep 6 week follow up with Dr.Mansouraty on : 04/23/21 @ 3:30pm  Thank you for choosing me and  [facility].  Dr. Rush Landmark'

## 2021-03-05 NOTE — Telephone Encounter (Signed)
Left message for patient to please call back. 

## 2021-03-07 ENCOUNTER — Encounter: Payer: Self-pay | Admitting: Gastroenterology

## 2021-03-07 NOTE — Progress Notes (Signed)
[place] VISIT   Primary Care Provider Patient, No Pcp Per (Inactive) No address on file None  Patient Profile: Nathan Kane is a 55 y.o. male with a pmh significant for obesity, chronic alcohol use disorder, colon polyps (TAs), hemorrhoids, diverticulosis, gastritis (negative for HP).  The patient presents to the [facility] for an evaluation and management of problem(s) noted below:  Problem List 1. Elevated LFTs   2. Alcohol use disorder, moderate, dependence ([place])     History of Present Illness Please see initial consultation note by PA Lemmon and my prior progress notes for full details of HPI.  Interval History Today, the patient returns for scheduled visit.  Liver enzymes have significantly decreased as of his last check.  He has decrease his alcohol consumption considerably.  States he is only drinking a few beverages on the weekend but not on a daily basis anymore.  He is feeling well.  He is worried about what the degree of liver disease is with his current process but is hopeful things will be okay.  He has gained weight since he stopped drinking alcohol.  No other issues have developed in the interim.  He is unaccompanied today.  The patient denies any issues with jaundice, scleral icterus, generalized pruritus, darkened/amber urine, clay-colored stools, LE edema, hematemesis, coffee-ground emesis, abdominal distention, confusion.   GI Review of Systems Positive as above Negative for pyrosis, dysphagia, odynophagia, pain, nausea, vomiting, change in bowel habits  Review of Systems General: Denies fevers/chills/weight loss unintentionally Cardiovascular: Denies chest pain Pulmonary: Denies shortness of breath Gastroenterological: See HPI Genitourinary: Denies darkened urine Hematological: Denies easy bruising/bleeding Dermatological: Denies jaundice Psychological: Mood is stable   Medications No current  outpatient medications on file.   No current facility-administered medications for this visit.    Allergies No Known Allergies  Histories Past Medical History:  Diagnosis Date   Acute hepatitis    Alcohol abuse    chronic   Diverticulosis    Gastritis    Gout    Hemorrhoids    Hx of adenomatous colonic polyps    Hyperlipemia    Iron deficiency anemia    Obesity    Past Surgical History:  Procedure Laterality Date   KNEE ARTHROSCOPY     left   OPEN REDUCTION INTERNAL FIXATION (ORIF) DISTAL RADIAL FRACTURE Left 05/27/2017   Procedure: OPEN REDUCTION INTERNAL FIXATION (ORIF) DISTAL RADIAL FRACTURE;  Surgeon: Iran Planas, MD;  Location: [place];  Service: Orthopedics;  Laterality: Left;   Social History   Socioeconomic History   Marital status: Married    Spouse name: Not on file   Number of children: 5   Years of education: Not on file   Highest education level: Not on file  Occupational History   Occupation: Curator  Tobacco Use   Smoking status: Never   Smokeless tobacco: Never  Vaping Use   Vaping Use: Never used  Substance and Sexual Activity   Alcohol use: Not Currently    Alcohol/week: 4.0 standard drinks    Types: 4 Cans of beer per week    Comment: 4 to 5 days a week   Drug use: No   Sexual activity: Not on file  Other Topics Concern   Not on file  Social History Narrative   Not on file   Social Determinants of Health   Financial Resource Strain: Not on file  Food Insecurity: Not on file  Transportation Needs: Not on file  Physical Activity: Not  on file  Stress: Not on file  Social Connections: Not on file  Intimate Partner Violence: Not on file   Family History  Problem Relation Age of Onset   Colon cancer Neg Hx    Esophageal cancer Neg Hx    Stomach cancer Neg Hx    Rectal cancer Neg Hx    Inflammatory bowel disease Neg Hx    Liver disease Neg Hx    Pancreatic cancer Neg Hx    I have reviewed his medical, social, and family history  in detail and updated the electronic medical record as necessary.    PHYSICAL EXAMINATION  BP 124/74   Pulse 81   Ht '5\' 8"'  (1.727 m)   Wt 186 lb (84.4 kg)   BMI 28.28 kg/m  Wt Readings from Last 3 Encounters:  03/05/21 186 lb (84.4 kg)  01/14/21 175 lb 4 oz (79.5 kg)  12/05/20 164 lb 8 oz (74.6 kg)  GEN: NAD, appears stated age, doesn't appear chronically ill PSYCH: Cooperative, without pressured speech EYE: Conjunctivae pink, sclerae anicteric ENT: MMM, without oral ulcers CV: Nontachycardic RESP: No audible wheezing GI: NABS, soft, NT/ND, without rebound or guarding, no HSM appreciated MSK/EXT: No lower extremity edema SKIN: No jaundice, no spider angiomata NEURO:  Alert & Oriented x 3, no focal deficits, no evidence of asterixis   REVIEW OF DATA  I reviewed the following data at the time of this encounter:  GI Procedures and Studies  No new studies to review  Laboratory Studies  Reviewed those in epic  Imaging Studies  March 2022 CT abdomen pelvis with contrast IMPRESSION: 1. No acute CT findings of the abdomen or pelvis to explain epigastric pain or abnormal LFTs. 2. No biliary ductal dilatation. 3. Mild prostatomegaly.   ASSESSMENT  [name] is a 55 y.o. male with a pmh significant for obesity, chronic alcohol use disorder, colon polyps (TAs), hemorrhoids, diverticulosis, gastritis (negative for HP).  The patient is seen today for evaluation and management of:  1. Elevated LFTs   2. Alcohol use disorder, moderate, dependence ([place])    The patient is clinically and hemodynamically stable today.  He has significantly decreased his alcohol consumption based on his reporting.  Hopefully things continue to remain stable to improved in regards to his underlying liver disease.  I am hopeful that we are able to find a reason for this.  With that being said, the degree of his liver transferase elevation by his PCP months ago is not just alcohol-related and thankfully  imaging has not shown any evidence of abnormalities to suggest ischemia or biliary obstruction or vascular compromise or shock.  This could have been some sort of viral process that heard and subsequently has improved but it is most important for him to continue his goal of alcohol cessation if possible.  Hopefully will not need a liver biopsy.  If labs look okay at this time we will see him back in 2 months if his labs remain elevated we will consider further laboratory evaluation.  All patient questions were answered to the best of my ability, and the patient agrees to the aforementioned plan of action with follow-up as indicated.   PLAN  Laboratories as outlined below Decision will be made if further laboratories and an extended liver biochemical profile will be necessary based on findings Will consider right upper quadrant ultrasound if LFTs remain elevated If LFTs improved, will see back in 2 months with labs Alcohol cut back and cessation is most  important for patient and he was counseled   Orders Placed This Encounter  Procedures   Comp Met (CMET)   INR/PT   Hepatitis C Antibody   Gamma GT    New Prescriptions   No medications on file   Modified Medications   No medications on file    Planned Follow Up No follow-ups on file.   Total Time in Face-to-Face and in Coordination of Care for patient including independent/personal interpretation/review of prior testing, medical history, examination, medication adjustment, communicating results with the patient directly, and documentation with the EHR is 20 minutes.   Justice Britain, MD [facility] Office # [phone]

## 2021-04-23 ENCOUNTER — Ambulatory Visit (INDEPENDENT_AMBULATORY_CARE_PROVIDER_SITE_OTHER): Payer: Self-pay | Admitting: Gastroenterology

## 2021-04-23 ENCOUNTER — Other Ambulatory Visit (INDEPENDENT_AMBULATORY_CARE_PROVIDER_SITE_OTHER): Payer: Self-pay

## 2021-04-23 ENCOUNTER — Encounter: Payer: Self-pay | Admitting: Gastroenterology

## 2021-04-23 VITALS — BP 122/78 | HR 97 | Ht 68.0 in | Wt 197.8 lb

## 2021-04-23 DIAGNOSIS — F1020 Alcohol dependence, uncomplicated: Secondary | ICD-10-CM

## 2021-04-23 DIAGNOSIS — D509 Iron deficiency anemia, unspecified: Secondary | ICD-10-CM

## 2021-04-23 DIAGNOSIS — R748 Abnormal levels of other serum enzymes: Secondary | ICD-10-CM

## 2021-04-23 DIAGNOSIS — F1021 Alcohol dependence, in remission: Secondary | ICD-10-CM

## 2021-04-23 DIAGNOSIS — R945 Abnormal results of liver function studies: Secondary | ICD-10-CM

## 2021-04-23 DIAGNOSIS — R7989 Other specified abnormal findings of blood chemistry: Secondary | ICD-10-CM

## 2021-04-23 DIAGNOSIS — E611 Iron deficiency: Secondary | ICD-10-CM

## 2021-04-23 LAB — COMPREHENSIVE METABOLIC PANEL
ALT: 26 U/L (ref 0–53)
AST: 24 U/L (ref 0–37)
Albumin: 4.5 g/dL (ref 3.5–5.2)
Alkaline Phosphatase: 50 U/L (ref 39–117)
BUN: 18 mg/dL (ref 6–23)
CO2: 29 mEq/L (ref 19–32)
Calcium: 9.6 mg/dL (ref 8.4–10.5)
Chloride: 103 mEq/L (ref 96–112)
Creatinine, Ser: 0.78 mg/dL (ref 0.40–1.50)
GFR: 102.15 mL/min (ref >60.00)
Glucose, Bld: 77 mg/dL (ref 70–99)
Potassium: 3.7 mEq/L (ref 3.5–5.1)
Sodium: 139 mEq/L (ref 135–145)
Total Bilirubin: 0.5 mg/dL (ref 0.2–1.2)
Total Protein: 7.2 g/dL (ref 6.0–8.3)

## 2021-04-23 LAB — CBC
HCT: 39.4 % (ref 39.0–52.0)
Hemoglobin: 13.6 g/dL (ref 13.0–17.0)
MCHC: 34.4 g/dL (ref 30.0–36.0)
MCV: 89.6 fl (ref 78.0–100.0)
Platelets: 237 10*3/uL (ref 150.0–400.0)
RBC: 4.4 Mil/uL (ref 4.22–5.81)
RDW: 14.3 % (ref 11.5–15.5)
WBC: 3.6 10*3/uL — ABNORMAL LOW (ref 4.0–10.5)

## 2021-04-23 LAB — PROTIME-INR
INR: 0.9 ratio (ref 0.8–1.0)
Prothrombin Time: 10.5 s (ref 9.6–13.1)

## 2021-04-23 LAB — IBC + FERRITIN
Iron: 129 ug/dL (ref 42–165)
Saturation Ratios: 24.2 % (ref 20.0–50.0)
TIBC: 532 ug/dL — ABNORMAL HIGH (ref 250.0–450.0)
Transferrin: 380 mg/dL — ABNORMAL HIGH (ref 212.0–360.0)

## 2021-04-23 NOTE — Progress Notes (Signed)
[place] VISIT   Primary Care Provider Patient, No Pcp Per (Inactive) No address on file None  Patient Profile: Nathan Kane is a 55 y.o. male with a pmh significant for obesity, chronic alcohol use disorder, colon polyps (TAs), hemorrhoids, diverticulosis, gastritis (negative for HP).  The patient presents to the [facility] for an evaluation and management of problem(s) noted below:  Problem List 1. Iron deficiency   2. Abnormal LFTs   3. Uncomplicated alcohol dependence (HCC)   4. Elevated serum GGT level      History of Present Illness Please see initial consultation note and prior progress notes for full details of HPI.    Interval History The patient returns for scheduled follow-up today.  The patient states that he has continued to minimize alcohol consumption but does describe occasional use every few weeks with the last use within the last week.  He is living back at home with his family and feels that things are doing better overall.  The patient denies any issues with jaundice, scleral icterus, generalized pruritus, darkened/amber urine, clay-colored stools, LE edema, hematemesis, coffee-ground emesis, abdominal distention, confusion.  He has been gaining weight since he significant alcohol consumption.  No rectal bleeding.    GI Review of Systems Positive as above Negative for dysphagia, nausea, vomiting, pain, alteration in bowel habits   Review of Systems General: Denies fevers/chills/weight loss unintentionally Cardiovascular: Denies chest pain Pulmonary: Denies shortness of breath Gastroenterological: See HPI Genitourinary: Denies darkened urine Hematological: Denies easy bruising/bleeding Dermatological: Denies jaundice Psychological: Mood is stable   Medications No current outpatient medications on file.   No current facility-administered medications for this visit.    Allergies No Known  Allergies  Histories Past Medical History:  Diagnosis Date   Acute hepatitis    Alcohol abuse    chronic   Diverticulosis    Gastritis    Gout    Hemorrhoids    Hx of adenomatous colonic polyps    Hyperlipemia    Iron deficiency anemia    Obesity    Past Surgical History:  Procedure Laterality Date   KNEE ARTHROSCOPY     left   OPEN REDUCTION INTERNAL FIXATION (ORIF) DISTAL RADIAL FRACTURE Left 05/27/2017   Procedure: OPEN REDUCTION INTERNAL FIXATION (ORIF) DISTAL RADIAL FRACTURE;  Surgeon: Iran Planas, MD;  Location: [place];  Service: Orthopedics;  Laterality: Left;   Social History   Socioeconomic History   Marital status: Married    Spouse name: Not on file   Number of children: 5   Years of education: Not on file   Highest education level: Not on file  Occupational History   Occupation: Curator  Tobacco Use   Smoking status: Never   Smokeless tobacco: Never  Vaping Use   Vaping Use: Never used  Substance and Sexual Activity   Alcohol use: Not Currently    Alcohol/week: 4.0 standard drinks    Types: 4 Cans of beer per week    Comment: 4 to 5 days a week   Drug use: No   Sexual activity: Not on file  Other Topics Concern   Not on file  Social History Narrative   Not on file   Social Determinants of Health   Financial Resource Strain: Not on file  Food Insecurity: Not on file  Transportation Needs: Not on file  Physical Activity: Not on file  Stress: Not on file  Social Connections: Not on file  Intimate Partner Violence: Not on file  Family History  Problem Relation Age of Onset   Colon cancer Neg Hx    Esophageal cancer Neg Hx    Stomach cancer Neg Hx    Rectal cancer Neg Hx    Inflammatory bowel disease Neg Hx    Liver disease Neg Hx    Pancreatic cancer Neg Hx    I have reviewed his medical, social, and family history in detail and updated the electronic medical record as necessary.    PHYSICAL EXAMINATION  BP 122/78   Pulse 97   Ht  '5\' 8"'  (1.727 m)   Wt 197 lb 12.8 oz (89.7 kg)   SpO2 98%   BMI 30.08 kg/m  Wt Readings from Last 3 Encounters:  04/23/21 197 lb 12.8 oz (89.7 kg)  03/05/21 186 lb (84.4 kg)  01/14/21 175 lb 4 oz (79.5 kg)  GEN: NAD, appears stated age, doesn't appear chronically ill PSYCH: Cooperative, without pressured speech EYE: Conjunctivae pink, sclerae anicteric ENT: Mask CV: Nontachycardic RESP: No audible wheezing GI: NABS, soft, NT/ND, without rebound or guarding MSK/EXT: No lower extremity edema SKIN: No jaundice, no spider angiomata NEURO:  Alert & Oriented x 3, no focal deficits, no evidence of asterixis   REVIEW OF DATA  I reviewed the following data at the time of this encounter:  GI Procedures and Studies  No new studies to review  Laboratory Studies  Reviewed those in epic  Imaging Studies  No new imaging studies to review   ASSESSMENT  Mr. Vanaman is a 55 y.o. male with a pmh significant for obesity, chronic alcohol use disorder, colon polyps (TAs), hemorrhoids, diverticulosis, gastritis (negative for HP).  The patient is seen today for evaluation and management of:  1. Iron deficiency   2. Abnormal LFTs   3. Uncomplicated alcohol dependence (HCC)   4. Elevated serum GGT level    The patient is hemodynamically and clinically stable.  Last set of hepatic function panel testing had suggested that he had normalization of his liver biochemical tests.  He continues to the have decreased alcohol consumption overall.  Imaging is not suggested cirrhosis.  Although we must continue to remember that the degree by which he had significant LFT abnormalities was much higher than would be expected for an alcoholic hepatitis when he was evaluated by his PCP in the ED it is still unclear as to whether there could have been a secondary injury/insult at that time.  Overall however I am hopeful that his liver tests remain normal and he shows no evidence of protein synthesis issues.  Time will  tell.  Patient has had iron deficiency and anemia previously we will recheck his iron labs and hopefully he has no further evidence of iron deficiency that could require a video capsule endoscopy.  Time will tell.  All patient questions were answered to the best of my ability, and the patient agrees to the aforementioned plan of action with follow-up as indicated.   PLAN  Laboratories as outlined below If LFTs remain stable then will repeat in 4 to 6 months Consider abdominal ultrasound in 1 year time.  From the last CT imaging (March 2023) Continue to decrease and minimize/cut down alcohol consumption as able If patient has evidence of iron deficiency +/- anemia patient will require video capsule endoscopy   Orders Placed This Encounter  Procedures   CBC   Comp Met (CMET)   IBC + Ferritin   INR/PT     New Prescriptions   No medications on  file   Modified Medications   No medications on file    Planned Follow Up No follow-ups on file.   Total Time in Face-to-Face and in Coordination of Care for patient including independent/personal interpretation/review of prior testing, medical history, examination, medication adjustment, communicating results with the patient directly, and documentation with the EHR is 25 minutes.   Justice Britain, MD [facility] Office # [phone]

## 2021-04-23 NOTE — Patient Instructions (Signed)
Your provider has requested that you go to the basement level for lab work before leaving today. Press "B" on the elevator. The lab is located at the first door on the left as you exit the elevator.  If you are age 53 or younger, your body mass index should be between 19-25. Your Body mass index is 30.08 kg/m. If this is out of the aformentioned range listed, please consider follow up with your Primary Care Provider.   __________________________________________________________  The [place] [facility] providers would like to encourage you to use [facility] to communicate with providers for non-urgent requests or questions.  Due to long hold times on the telephone, sending your provider a message by [facility] may be a faster and more efficient way to get a response.  Please allow 48 business hours for a response.  Please remember that this is for non-urgent requests.    Due to recent changes in healthcare laws, you may see the results of your imaging and laboratory studies on MyChart before your provider has had a chance to review them.  We understand that in some cases there may be results that are confusing or concerning to you. Not all laboratory results come back in the same time frame and the provider may be waiting for multiple results in order to interpret others.  Please give [place] 48 hours in order for your provider to thoroughly review all the results before contacting the office for clarification of your results.   Follow-Up in 6 month  Thank you for choosing me and [facility].  Dr. Rush Landmark

## 2021-04-26 ENCOUNTER — Encounter: Payer: Self-pay | Admitting: Gastroenterology

## 2021-04-26 DIAGNOSIS — E611 Iron deficiency: Secondary | ICD-10-CM | POA: Insufficient documentation

## 2021-04-26 DIAGNOSIS — R748 Abnormal levels of other serum enzymes: Secondary | ICD-10-CM | POA: Insufficient documentation

## 2021-04-26 DIAGNOSIS — F1020 Alcohol dependence, uncomplicated: Secondary | ICD-10-CM | POA: Insufficient documentation

## 2021-10-29 ENCOUNTER — Telehealth: Payer: Self-pay

## 2021-10-29 DIAGNOSIS — R7989 Other specified abnormal findings of blood chemistry: Secondary | ICD-10-CM

## 2021-10-29 NOTE — Telephone Encounter (Signed)
Labs have been entered and appt made for 11/25/21 at 8:50 am with GM.  A letter has been mailed to the pt.

## 2021-10-29 NOTE — Telephone Encounter (Signed)
-----   Message from Timothy Lasso, RN sent at 04/28/2021 11:41 AM EDT ----- repeat his liver tests and INR in 4 to 6 months and he will see [place] in clinic after those are done

## 2021-11-25 ENCOUNTER — Ambulatory Visit: Payer: Self-pay | Admitting: Gastroenterology

## 2022-06-30 ENCOUNTER — Ambulatory Visit (INDEPENDENT_AMBULATORY_CARE_PROVIDER_SITE_OTHER): Payer: Self-pay | Admitting: Family Medicine

## 2022-06-30 ENCOUNTER — Ambulatory Visit: Payer: Self-pay

## 2022-06-30 VITALS — BP 128/76 | Ht 68.0 in | Wt 180.0 lb

## 2022-06-30 DIAGNOSIS — M79672 Pain in left foot: Secondary | ICD-10-CM

## 2022-06-30 DIAGNOSIS — M79671 Pain in right foot: Secondary | ICD-10-CM

## 2022-06-30 NOTE — Progress Notes (Unsigned)
  Nathan Kane - 56 y.o. male MRN [id number]  Date of birth: 1966-04-23    CHIEF COMPLAINT:   Bilateral foot pain    SUBJECTIVE:   HPI:  Patient speaks a combination of [place] and [place].  There was an interpreter present in the room today to assist as needed.  Pleasant 56 year old male comes clinic to be evaluated for bilateral foot pain.  Says the foot pain has been present for about 2 years off-and-on.  It has been acutely worsening over the last several weeks.  He says about 2 years ago he was in [place] and stepped on some broken pieces of glass.  He had a doctor remove several pieces of glass from the heels of both of his feet, however he still believes there are some pieces of glass that have remained inside his foot all this time.  Today, he reports bilateral foot pain, however it is not located near the heel.  He reports pain on the tops of the feet bilaterally and in the mid feet bilaterally.  The pain is worse with walking.  It is sharp and localized only to these areas.  He has not tried any medicines or stretches.  ROS:     See HPI  PERTINENT  PMH / PSH FH / / SH:  Past Medical, Surgical, Social, and Family History Reviewed & Updated in the EMR.  Pertinent findings include:  None  OBJECTIVE: BP 128/76   Ht '5\' 8"'$  (1.727 m)   Wt 180 lb (81.6 kg)   BMI 27.37 kg/m   Physical Exam:  Vital signs are reviewed.  GEN: Alert and oriented, NAD Pulm: Breathing unlabored PSY: normal mood, congruent affect  MSK: Bilateral feet -there are no obvious foreign bodies or lacerations.  There is no palpable foreign body in the heels bilaterally.  He is nontender to palpation at the medial malleolus, lateral malleolus, navicular, base of fifth metatarsal bilaterally.  He is tender to palpation over the dorsum of the midfoot bilaterally.  He is mildly tender along the plantar fascia but not tender at the base of the calcaneus bilaterally.  He has full range of motion at the ankles  bilaterally.  5/5 strength throughout.  He has equivocal 1+ anterior drawer test bilaterally.  He has quite limited motion with talar tilt test and inversion and eversion bilaterally.  Neurovascular intact distally.  ULTRASOUND: Point-of-care ultrasound was used to evaluate the plantar feet bilaterally.  No foreign bodies were appreciated.  Impression: Unremarkable bilateral soft tissues with no evidence of foreign body  ASSESSMENT & PLAN:  1.  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  Reassurance was provided that he does not have any foreign bodies in his feet, although do believe he was somewhat skeptical.  Advised conservative measures including oral anti-inflammatories, topical anti-inflammatory such as Voltaren, green sport orthotics with scaphoid pads and plantar fascia exercises. Follow-up in 6 weeks or as needed.    Dortha Kern, MD PGY-4, Sports Medicine Fellow [place]

## 2022-06-30 NOTE — Patient Instructions (Signed)
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 Sports insoles with scaphoid pads help cushion this. Icing 15 minutes at a time as needed. Do home exercises and stretches for plantar fasciitis. Voltaren gel topically up to 4 times a day. Consider boswellia extract (can get at [facility]) to help with the arthritis. Follow up with me in 6 weeks or as needed.

## 2022-07-01 ENCOUNTER — Encounter: Payer: Self-pay | Admitting: Family Medicine

## 2023-06-08 ENCOUNTER — Emergency Department (HOSPITAL_COMMUNITY)
Admission: EM | Admit: 2023-06-08 | Discharge: 2023-06-08 | Disposition: A | Discharge status: Home / Self Care | Payer: Self-pay | Attending: Emergency Medicine | Admitting: Emergency Medicine

## 2023-06-08 ENCOUNTER — Encounter (HOSPITAL_COMMUNITY): Payer: Self-pay | Admitting: Emergency Medicine

## 2023-06-08 ENCOUNTER — Emergency Department (HOSPITAL_COMMUNITY): Payer: Self-pay

## 2023-06-08 DIAGNOSIS — M79672 Pain in left foot: Secondary | ICD-10-CM | POA: Insufficient documentation

## 2023-06-08 MED ORDER — IBUPROFEN 800 MG PO TABS
800.0000 mg | ORAL_TABLET | Freq: Once | ORAL | Status: AC
  Administered 2023-06-08: 800 mg via ORAL
  Filled 2023-06-08: qty 1

## 2023-06-08 NOTE — ED Triage Notes (Signed)
Pt here from home with c/o left foot pain since Friday off and on , no trauma noted

## 2023-06-08 NOTE — Discharge Instructions (Addendum)
Le han atendido hoy por su queja de dolor en el pie izquierdo. Su imagen mostr artritis pero por lo dems fue tranquilizadora. Sus medicamentos de alta incluyen Tylenol e ibuprofeno alternativos para Chief Technology Officer. Puede alternarlos cada 4 horas. Puede tomar hasta 800 mg de ibuprofeno a la vez y hasta 1000 mg de tylenol. Las instrucciones de cuidado en el hogar son las siguientes:  [place] el zapato que te regalamos mientras caminas hasta que tu dolor mejore. Haga un seguimiento con: Su PCP en 1 semana para una reevaluacin Busque atencin mdica inmediata si desarrolla alguno de los siguientes sntomas:  El pie o los dedos de ese pie se tornan de color blanco o [place].  Tiene el pie enrojecido y caliente al tacto. En este momento no parece haber una condicin [employer], sin embargo, siempre existe la posibilidad de que las condiciones [place]. [place] y siga las instrucciones a continuacin.  No tome su medicamento si desarrolla sarpullido con picazn, hinchazn en la boca o los labios o dificultad para respirar; llame al 911 y busque atencin mdica de emergencia inmediata si esto ocurre.  Puede revisar sus pruebas de laboratorio y [place] de imgenes en su totalidad en su cuenta MyChart.  Analice todos los resultados de forma completa con su proveedor de atencin primaria y otro especialista en su visita de seguimiento.  Nota: Es posible que partes de este texto se hayan transcrito utilizando un software de Network engineer de voz. Se hizo todo lo posible para garantizar la precisin; sin embargo, es posible que an Nutritional therapist errores de transcripcin computarizados involuntarios.   You have been seen today for your complaint of left foot pain. Your imaging showed arthritis but was otherwise reassuring. Your discharge medications include Alternate tylenol and ibuprofen for pain. You may alternate these every 4 hours. You may take up to 800 mg of ibuprofen at a time and up to 1000 mg of tylenol. Home  care instructions are as follows:  Wear the shoe that we gave you while walking until your pain improves Follow up with: Your PCP in 1 week for reevaluation Please seek immediate medical care if you develop any of the following symptoms: El pie o los dedos de ese pie se tornan de color blanco o az[place]. Tiene el pie enrojecido y caliente al tacto. At this time there does not appear to be the presence of an emergent medical condition, however there is always the potential for conditions to change. Please read and follow the below instructions.  Do not take your medicine if  develop an itchy rash, swelling in your mouth or lips, or difficulty breathing; call 911 and seek immediate emergency medical attention if this occurs.  You may review your lab tests and imaging results in their entirety on your MyChart account.  Please discuss all results of fully with your primary care provider and other specialist at your follow-up visit.  Note: Portions of this text may have been transcribed using voice recognition software. Every effort was made to ensure accuracy; however, inadvertent computerized transcription errors may still be present.

## 2023-06-08 NOTE — ED Provider Notes (Signed)
[place] EMERGENCY DEPARTMENT AT [facility] Provider Note   CSN: [id number] Arrival date & time: 06/08/23  1122     History  Chief Complaint  Patient presents with   Foot Pain    Nathan Kane is a 57 y.o. male.  With history of gout, hyperlipidemia, alcohol abuse presenting for evaluation of left foot pain.  He states this pain began on Friday and has progressively gotten worse.  He was unable to walk yesterday due to the pain.  He states it feels similar to previous episodes of gout.  Pain is localized mostly to the left lateral midfoot, however he also has pain to the left ankle.  He denies any fevers or chills.  He has not taken anything for his pain prior to arrival.  He denies any fevers or chills.  No trauma.  He states he walks on stilts at work.  History was provided by the patient with use of a Spanish interpreter   Foot Pain       Home Medications Prior to Admission medications   Not on File      Allergies    Patient has no known allergies.    Review of Systems   Review of Systems  Musculoskeletal:  Positive for arthralgias.  All other systems reviewed and are negative.   Physical Exam Updated Vital Signs BP 125/80 (BP Location: Right Arm)   Pulse 97   Temp 98.7 F (37.1 C)   Resp 19   SpO2 98%  Physical Exam Vitals and nursing note reviewed.  Constitutional:      General: He is not in acute distress.    Appearance: Normal appearance. He is normal weight. He is not ill-appearing.  HENT:     Head: Normocephalic and atraumatic.  Pulmonary:     Effort: Pulmonary effort is normal. No respiratory distress.  Abdominal:     General: Abdomen is flat.  Musculoskeletal:        General: Normal range of motion.     Cervical back: Neck supple.  Feet:     Comments: No swelling appreciated of the left foot when compared contralaterally.  TTP to the left lateral midfoot.  No erythema to either foot.  Full AROM of all digits and ankle.   Sensation intact in all digits.  Capillary refill normal.  DP pulses 2+ bilaterally.  No bruising or wounds. Skin:    General: Skin is warm and dry.  Neurological:     Mental Status: He is alert and oriented to person, place, and time.  Psychiatric:        Mood and Affect: Mood normal.        Behavior: Behavior normal.     ED Results / Procedures / Treatments   Labs (all labs ordered are listed, but only abnormal results are displayed) Labs Reviewed - No data to display  EKG None  Radiology DG Foot Complete Left  Result Date: 06/08/2023 CLINICAL DATA:  Chronic plantar left foot pain near the fourth and fifth metatarsals. EXAM: LEFT FOOT - COMPLETE 3+ VIEW COMPARISON:  CT left foot dated April 05, 2017. Left foot x-rays dated February 15, 2017. FINDINGS: No acute fracture or dislocation. Progressive mild first MTP joint osteoarthritis. Progressive mild dorsal midfoot degenerative spurring. Bone mineralization is normal. Soft tissues are unremarkable. IMPRESSION: 1. 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 Electronically Signed   By: Obie Dredge M.D.   On: 06/08/2023 13:30    Procedures Procedures  Medications Ordered in ED Medications  ibuprofen (ADVIL) tablet 800 mg (has no administration in time range)    ED Course/ Medical Decision Making/ A&P                                 Medical Decision Making Amount and/or Complexity of Data Reviewed Radiology: ordered.  Risk Prescription drug management.   This patient presents to the ED for concern of left foot pain, this involves an extensive number of treatment options, and is a complaint that carries with it a high risk of complications and morbidity.  The differential diagnosis includes fracture, strain, sprain, contusion, abrasion, OA, RA, septic arthritis, gout, pseudogout  My initial workup includes imaging  Additional history obtained from: Nursing notes from this visit.  I ordered, reviewed  and interpreted labs which include: X-ray left foot  Afebrile, hemodynamically stable.  57 year old male presenting for evaluation of left foot pain.  This is atraumatic.  Patient appears well on exam.  Neurovascular status is intact.  There is no appreciable swelling to the left foot.  No overlying erythema.  Low suspicion of septic arthritis as the cause of his pain.  He reports a history of gout and states it feels similar.  Would be an odd presentation to have gout of the MTP joint of the midfoot, however still possible.  Overall I do suspect osteoarthritis as the cause of his pain.  X-ray shows arthritis as well.  Patient will be treated with Motrin and a postop shoe for comfort.  He was given return precautions.  He was encouraged to follow-up with his PCP for reevaluation in 1 week.  Stable at discharge.  At this time there does not appear to be any evidence of an acute emergency medical condition and the patient appears stable for discharge with appropriate outpatient follow up. Diagnosis was discussed with patient who verbalizes understanding of care plan and is agreeable to discharge. I have discussed return precautions with patient who verbalizes understanding. Patient encouraged to follow-up with their PCP within 1 week. All questions answered.  Note: Portions of this report may have been transcribed using voice recognition software. Every effort was made to ensure accuracy; however, inadvertent computerized transcription errors may still be present.        Final Clinical Impression(s) / ED Diagnoses Final diagnoses:  Foot pain, left    Rx / DC Orders ED Discharge Orders     None         Mora Bellman 06/08/23 1347    Rondel Baton, MD 06/10/23 1524

## 2023-06-16 ENCOUNTER — Ambulatory Visit (INDEPENDENT_AMBULATORY_CARE_PROVIDER_SITE_OTHER): Payer: Self-pay

## 2023-06-16 ENCOUNTER — Encounter: Payer: Self-pay | Admitting: Podiatry

## 2023-06-16 ENCOUNTER — Ambulatory Visit (INDEPENDENT_AMBULATORY_CARE_PROVIDER_SITE_OTHER): Payer: Self-pay | Admitting: Podiatry

## 2023-06-16 VITALS — BP 136/80 | HR 67 | Temp 97.3°F | Resp 18 | Ht 68.0 in | Wt 180.0 lb

## 2023-06-16 DIAGNOSIS — M778 Other enthesopathies, not elsewhere classified: Secondary | ICD-10-CM

## 2023-06-16 DIAGNOSIS — S90851A Superficial foreign body, right foot, initial encounter: Secondary | ICD-10-CM

## 2023-06-16 DIAGNOSIS — S90852A Superficial foreign body, left foot, initial encounter: Secondary | ICD-10-CM

## 2023-06-16 MED ORDER — METHYLPREDNISOLONE 4 MG PO TBPK: ORAL_TABLET | ORAL | 0 refills | Status: AC

## 2023-06-16 NOTE — Progress Notes (Signed)
Subjective:   Patient ID: Nathan Kane, male   DOB: 57 y.o.   MRN: [id number]   HPI Chief Complaint  Patient presents with   Foot Pain    Patient is here for bilateral foot swelling and pain, HX of surgical removal of glass from both feet   57 year old male with a history of stepping on glass and subsequent removal of glass pieces from both feet, presents with chronic bilateral foot pain and swelling that has been ongoing for approximately five to six years. The pain and swelling have recently increased, prompting a visit to the ER where the patient was diagnosed with arthritis. The patient reports the most discomfort on the side of the foot, and has been altering his gait due to the pain. The patient denies any recent incidents of stepping on glass or other sharp objects. The patient also reports that the pain is somewhat relieved by Tylenol.   Review of Systems  All other systems reviewed and are negative.  Past Medical History:  Diagnosis Date   Acute hepatitis    Alcohol abuse    chronic   Diverticulosis    Gastritis    Gout    Hemorrhoids    Hx of adenomatous colonic polyps    Hyperlipemia    Iron deficiency anemia    Obesity     Past Surgical History:  Procedure Laterality Date   KNEE ARTHROSCOPY     left   OPEN REDUCTION INTERNAL FIXATION (ORIF) DISTAL RADIAL FRACTURE Left 05/27/2017   Procedure: OPEN REDUCTION INTERNAL FIXATION (ORIF) DISTAL RADIAL FRACTURE;  Surgeon: Bradly Bienenstock, MD;  Location: [facility] OR;  Service: Orthopedics;  Laterality: Left;     Current Outpatient Medications:    methylPREDNISolone (MEDROL DOSEPAK) 4 MG TBPK tablet, Take as directed, Disp: 21 tablet, Rfl: 0  No Known Allergies        Objective:  Physical Exam  General: AAO x3, NAD  Dermatological: Skin is warm, dry and supple bilateral. There are no open sores, no preulcerative lesions, no rash or signs of infection present.  Unable to appreciate any area of puncture wounds or  foreign bodies.  Vascular: Dorsalis Pedis artery and Posterior Tibial artery pedal pulses are 2/4 bilateral with immedate capillary fill time. There is no pain with calf compression, swelling, warmth, erythema.   Neruologic: Grossly intact via light touch bilateral.   Musculoskeletal: Moderate tenderness is localized to the lateral aspects of bilateral feet.  Specific metatarsal base, fifth MTPJ.  There are some trace edema.  There is no erythema or warmth.  Not able to identify any area of a puncture wound or foreign object.  No area pinpoint tenderness identified otherwise.  Gait: Unassisted, Nonantalgic.       Assessment:   Capsulitis, concern for possible residual foreign body     Plan:  Chronic Foot Pain and Swelling Pain and swelling in both feet for the past 5-6 years, exacerbated last week.  -Independently reviewed the x-rays with the patient and family member that performed last week. -His wife brought up possible residual foreign objects.  He could be back to his more of a musculoskeletal issue or even possibly gout. -Order ultrasound of both feet to rule out residual glass fragments. -Order blood work to rule out other causes such as gout. -Short course of prednisone to reduce inflammation and pain. -Follow-up in 6 weeks to reevaluate condition and discuss results of ultrasound and blood work.  Vivi Barrack DPM

## 2023-06-17 LAB — URIC ACID: Uric Acid: 9.1 mg/dL — ABNORMAL HIGH (ref 3.8–8.4)

## 2023-06-17 LAB — SEDIMENTATION RATE: Sed Rate: 5 mm/h (ref 0–30)

## 2023-06-17 LAB — C-REACTIVE PROTEIN: CRP: 2 mg/L (ref 0–10)

## 2023-06-23 ENCOUNTER — Telehealth: Payer: Self-pay

## 2023-06-23 NOTE — Telephone Encounter (Addendum)
Patient's niece called inquiring about referral for Ultrasound. She would like to know where this is going to be done at. Referral just states internal. .Patient is a self pay patient so no PA to be done Please advise.

## 2023-06-24 ENCOUNTER — Telehealth: Payer: Self-pay | Admitting: Podiatry

## 2023-06-24 ENCOUNTER — Encounter: Payer: Self-pay | Admitting: Podiatry

## 2023-06-24 ENCOUNTER — Other Ambulatory Visit: Payer: Self-pay | Admitting: Podiatry

## 2023-06-24 DIAGNOSIS — S90852A Superficial foreign body, left foot, initial encounter: Secondary | ICD-10-CM

## 2023-06-24 DIAGNOSIS — S90851A Superficial foreign body, right foot, initial encounter: Secondary | ICD-10-CM

## 2023-06-24 NOTE — Telephone Encounter (Signed)
Spoke to centralized scheduling to see when/where the patient's foot ultrasound will be, since the patient called inquiring about it.  He saw the order, but was surprised it hadn't been scheduled yet, so he was going to message their Spanish-speaking scheduler and have that individual call the patient today to get scheduled.  This will either be at [facility], [facility], or [facility] locations.

## 2023-06-27 ENCOUNTER — Telehealth: Payer: Self-pay | Admitting: Podiatry

## 2023-06-27 NOTE — Telephone Encounter (Signed)
Received a call from [facility] and they are not able to get in touch with pt and the number in our system is not working, they also tried calling the pts emergency contact and that number did not work either.   I have sent them a mychart message with our number and the imaging centers number to call to give an updated phone number.

## 2023-06-27 NOTE — Telephone Encounter (Signed)
I called and spoke with patient's niece and informed her that order has been sent to [facility] and she was given the umber to call and schedule appointment.

## 2023-06-28 ENCOUNTER — Encounter: Payer: Self-pay | Admitting: Podiatry

## 2023-07-07 ENCOUNTER — Ambulatory Visit
Admission: RE | Admit: 2023-07-07 | Discharge: 2023-07-07 | Disposition: A | Discharge status: Home / Self Care | Payer: No Typology Code available for payment source | Source: Ambulatory Visit | Attending: Podiatry | Admitting: Podiatry

## 2023-07-07 DIAGNOSIS — S90852A Superficial foreign body, left foot, initial encounter: Secondary | ICD-10-CM

## 2023-07-07 DIAGNOSIS — S90851A Superficial foreign body, right foot, initial encounter: Secondary | ICD-10-CM

## 2023-07-21 ENCOUNTER — Telehealth: Payer: Self-pay | Admitting: Podiatry

## 2023-07-21 ENCOUNTER — Encounter: Payer: Self-pay | Admitting: Podiatry

## 2023-07-21 NOTE — Telephone Encounter (Signed)
Tried calling number on file to let pt know that we have not received the ultrasound results yet, number on file is out of service. Tried calling emergency contact, received VM, unable to leave VM.

## 2023-07-21 NOTE — Telephone Encounter (Signed)
pt called about imaging that he had had done. he wants to know what the results anre

## 2023-07-21 NOTE — Telephone Encounter (Signed)
We have tried to call the patient (in spanish) to go over results again.

## 2023-07-22 NOTE — Telephone Encounter (Signed)
Tried calling pt again, received no answer.

## 2023-08-01 NOTE — Telephone Encounter (Signed)
Can you please let him know that the ultrasound was normal. His uric acid level is high, and I think he has gout. It has been some time since I saw him. If he is still having issues, let me know and I will send medication over for gout. Thanks!

## 2023-08-04 ENCOUNTER — Encounter: Payer: Self-pay | Admitting: Podiatry

## 2023-08-04 ENCOUNTER — Telehealth: Payer: Self-pay | Admitting: Podiatry
